# Patient Record
Sex: Female | Born: 1970 | Race: Asian | Hispanic: No | Marital: Married | Smoking: Never smoker
Health system: Southern US, Community
[De-identification: ages and names within clinical notes are randomized; demographics above are authoritative.]

## PROBLEM LIST (undated history)

## (undated) DIAGNOSIS — K921 Melena: Secondary | ICD-10-CM

## (undated) DIAGNOSIS — Z923 Personal history of irradiation: Secondary | ICD-10-CM

## (undated) DIAGNOSIS — R519 Headache, unspecified: Secondary | ICD-10-CM

## (undated) DIAGNOSIS — R51 Headache: Secondary | ICD-10-CM

## (undated) DIAGNOSIS — C50919 Malignant neoplasm of unspecified site of unspecified female breast: Secondary | ICD-10-CM

## (undated) DIAGNOSIS — G43909 Migraine, unspecified, not intractable, without status migrainosus: Secondary | ICD-10-CM

## (undated) DIAGNOSIS — Z9221 Personal history of antineoplastic chemotherapy: Secondary | ICD-10-CM

## (undated) DIAGNOSIS — C801 Malignant (primary) neoplasm, unspecified: Secondary | ICD-10-CM

## (undated) HISTORY — PX: BREAST SURGERY: SHX581

## (undated) HISTORY — DX: Headache: R51

## (undated) HISTORY — DX: Migraine, unspecified, not intractable, without status migrainosus: G43.909

## (undated) HISTORY — DX: Headache, unspecified: R51.9

## (undated) HISTORY — DX: Malignant (primary) neoplasm, unspecified: C80.1

## (undated) HISTORY — DX: Melena: K92.1

---

## 2002-04-18 ENCOUNTER — Other Ambulatory Visit: Admission: RE | Admit: 2002-04-18 | Discharge: 2002-04-18 | Payer: Self-pay | Admitting: Obstetrics and Gynecology

## 2002-11-01 ENCOUNTER — Inpatient Hospital Stay (HOSPITAL_COMMUNITY): Admission: AD | Admit: 2002-11-01 | Discharge: 2002-11-01 | Payer: Self-pay | Admitting: *Deleted

## 2002-11-02 ENCOUNTER — Inpatient Hospital Stay (HOSPITAL_COMMUNITY): Admission: AD | Admit: 2002-11-02 | Discharge: 2002-11-04 | Payer: Self-pay | Admitting: Obstetrics and Gynecology

## 2004-08-23 ENCOUNTER — Encounter: Admission: RE | Admit: 2004-08-23 | Discharge: 2004-08-23 | Payer: Self-pay | Admitting: Internal Medicine

## 2006-01-24 DIAGNOSIS — Z9221 Personal history of antineoplastic chemotherapy: Secondary | ICD-10-CM

## 2006-01-24 HISTORY — PX: BREAST BIOPSY: SHX20

## 2006-01-24 HISTORY — PX: BREAST LUMPECTOMY: SHX2

## 2006-01-24 HISTORY — DX: Personal history of antineoplastic chemotherapy: Z92.21

## 2006-04-04 ENCOUNTER — Encounter: Admission: RE | Admit: 2006-04-04 | Discharge: 2006-04-04 | Payer: Self-pay | Admitting: Internal Medicine

## 2006-04-11 ENCOUNTER — Encounter (INDEPENDENT_AMBULATORY_CARE_PROVIDER_SITE_OTHER): Payer: Self-pay | Admitting: Radiology

## 2006-04-11 ENCOUNTER — Encounter (INDEPENDENT_AMBULATORY_CARE_PROVIDER_SITE_OTHER): Payer: Self-pay | Admitting: Specialist

## 2006-04-11 ENCOUNTER — Encounter: Admission: RE | Admit: 2006-04-11 | Discharge: 2006-04-11 | Payer: Self-pay | Admitting: Internal Medicine

## 2006-04-19 ENCOUNTER — Encounter: Admission: RE | Admit: 2006-04-19 | Discharge: 2006-04-19 | Payer: Self-pay | Admitting: Internal Medicine

## 2006-04-28 ENCOUNTER — Encounter: Admission: RE | Admit: 2006-04-28 | Discharge: 2006-04-28 | Payer: Self-pay | Admitting: Urology

## 2006-05-01 ENCOUNTER — Ambulatory Visit (HOSPITAL_COMMUNITY): Admission: RE | Admit: 2006-05-01 | Discharge: 2006-05-01 | Payer: Self-pay | Admitting: Nephrology

## 2006-05-01 ENCOUNTER — Ambulatory Visit (HOSPITAL_BASED_OUTPATIENT_CLINIC_OR_DEPARTMENT_OTHER): Admission: RE | Admit: 2006-05-01 | Discharge: 2006-05-01 | Payer: Self-pay | Admitting: General Surgery

## 2006-05-01 ENCOUNTER — Encounter (INDEPENDENT_AMBULATORY_CARE_PROVIDER_SITE_OTHER): Payer: Self-pay | Admitting: Specialist

## 2006-05-11 ENCOUNTER — Ambulatory Visit: Payer: Self-pay | Admitting: Oncology

## 2006-05-17 LAB — CBC WITH DIFFERENTIAL/PLATELET
Basophils Absolute: 0 10*3/uL (ref 0.0–0.1)
Eosinophils Absolute: 0.1 10*3/uL (ref 0.0–0.5)
HGB: 12.2 g/dL (ref 11.6–15.9)
LYMPH%: 26.3 % (ref 14.0–48.0)
MCV: 86.3 fL (ref 81.0–101.0)
MONO%: 9.8 % (ref 0.0–13.0)
NEUT#: 2.3 10*3/uL (ref 1.5–6.5)
Platelets: 296 10*3/uL (ref 145–400)

## 2006-05-17 LAB — COMPREHENSIVE METABOLIC PANEL
Albumin: 4.4 g/dL (ref 3.5–5.2)
Alkaline Phosphatase: 44 U/L (ref 39–117)
BUN: 11 mg/dL (ref 6–23)
CO2: 26 mEq/L (ref 19–32)
Glucose, Bld: 121 mg/dL — ABNORMAL HIGH (ref 70–99)
Potassium: 4 mEq/L (ref 3.5–5.3)

## 2006-05-17 LAB — CANCER ANTIGEN 27.29: CA 27.29: 17 U/mL (ref 0–39)

## 2006-05-17 LAB — LACTATE DEHYDROGENASE: LDH: 126 U/L (ref 94–250)

## 2006-05-23 ENCOUNTER — Ambulatory Visit (HOSPITAL_COMMUNITY): Admission: RE | Admit: 2006-05-23 | Discharge: 2006-05-23 | Payer: Self-pay | Admitting: Oncology

## 2006-06-06 LAB — HEPATITIS B SURFACE ANTIGEN: Hepatitis B Surface Ag: NEGATIVE

## 2006-06-06 LAB — HEPATITIS C ANTIBODY: HCV Ab: NEGATIVE

## 2006-06-14 ENCOUNTER — Encounter (INDEPENDENT_AMBULATORY_CARE_PROVIDER_SITE_OTHER): Payer: Self-pay | Admitting: Gynecology

## 2006-06-14 ENCOUNTER — Ambulatory Visit: Payer: Self-pay | Admitting: Obstetrics and Gynecology

## 2006-06-21 ENCOUNTER — Ambulatory Visit: Payer: Self-pay | Admitting: Oncology

## 2006-06-21 LAB — CBC WITH DIFFERENTIAL/PLATELET
BASO%: 1.2 % (ref 0.0–2.0)
LYMPH%: 32.4 % (ref 14.0–48.0)
MCHC: 33.9 g/dL (ref 32.0–36.0)
MONO#: 0.4 10*3/uL (ref 0.1–0.9)
Platelets: 282 10*3/uL (ref 145–400)
RBC: 4.53 10*6/uL (ref 3.70–5.32)
WBC: 4.9 10*3/uL (ref 3.9–10.0)
lymph#: 1.6 10*3/uL (ref 0.9–3.3)

## 2006-06-28 LAB — CBC WITH DIFFERENTIAL/PLATELET
BASO%: 0.1 % (ref 0.0–2.0)
HCT: 34.6 % — ABNORMAL LOW (ref 34.8–46.6)
MCHC: 34.6 g/dL (ref 32.0–36.0)
MONO#: 0.7 10*3/uL (ref 0.1–0.9)
NEUT%: 86.3 % — ABNORMAL HIGH (ref 39.6–76.8)
RDW: 12.8 % (ref 11.3–14.5)
WBC: 21 10*3/uL — ABNORMAL HIGH (ref 3.9–10.0)
lymph#: 2 10*3/uL (ref 0.9–3.3)

## 2006-07-05 LAB — CBC WITH DIFFERENTIAL/PLATELET
Basophils Absolute: 0 10*3/uL (ref 0.0–0.1)
Eosinophils Absolute: 0 10*3/uL (ref 0.0–0.5)
HCT: 34.7 % — ABNORMAL LOW (ref 34.8–46.6)
HGB: 12 g/dL (ref 11.6–15.9)
MCH: 29.5 pg (ref 26.0–34.0)
MONO#: 0.3 10*3/uL (ref 0.1–0.9)
NEUT#: 3.3 10*3/uL (ref 1.5–6.5)
NEUT%: 65.9 % (ref 39.6–76.8)
WBC: 4.9 10*3/uL (ref 3.9–10.0)
lymph#: 1.3 10*3/uL (ref 0.9–3.3)

## 2006-07-10 LAB — CBC WITH DIFFERENTIAL/PLATELET
Basophils Absolute: 0 10*3/uL (ref 0.0–0.1)
EOS%: 0.4 % (ref 0.0–7.0)
Eosinophils Absolute: 0 10*3/uL (ref 0.0–0.5)
HCT: 33.6 % — ABNORMAL LOW (ref 34.8–46.6)
HGB: 11.8 g/dL (ref 11.6–15.9)
MCH: 30.2 pg (ref 26.0–34.0)
MCV: 86.3 fL (ref 81.0–101.0)
MONO%: 6.9 % (ref 0.0–13.0)
NEUT#: 2.5 10*3/uL (ref 1.5–6.5)
NEUT%: 59.1 % (ref 39.6–76.8)

## 2006-07-17 LAB — CBC WITH DIFFERENTIAL/PLATELET
Basophils Absolute: 0 10*3/uL (ref 0.0–0.1)
EOS%: 0.4 % (ref 0.0–7.0)
Eosinophils Absolute: 0.1 10*3/uL (ref 0.0–0.5)
HCT: 33.8 % — ABNORMAL LOW (ref 34.8–46.6)
HGB: 11.6 g/dL (ref 11.6–15.9)
MCH: 29.8 pg (ref 26.0–34.0)
MCV: 86.6 fL (ref 81.0–101.0)
MONO%: 5.7 % (ref 0.0–13.0)
NEUT#: 14.3 10*3/uL — ABNORMAL HIGH (ref 1.5–6.5)
NEUT%: 83.1 % — ABNORMAL HIGH (ref 39.6–76.8)
lymph#: 1.8 10*3/uL (ref 0.9–3.3)

## 2006-07-17 LAB — COMPREHENSIVE METABOLIC PANEL
AST: 28 U/L (ref 0–37)
Albumin: 4.3 g/dL (ref 3.5–5.2)
BUN: 12 mg/dL (ref 6–23)
Calcium: 9.5 mg/dL (ref 8.4–10.5)
Chloride: 105 mEq/L (ref 96–112)
Creatinine, Ser: 0.71 mg/dL (ref 0.40–1.20)
Glucose, Bld: 93 mg/dL (ref 70–99)
Potassium: 4.2 mEq/L (ref 3.5–5.3)

## 2006-07-31 LAB — CBC WITH DIFFERENTIAL/PLATELET
BASO%: 2.8 % — ABNORMAL HIGH (ref 0.0–2.0)
Basophils Absolute: 0.1 10*3/uL (ref 0.0–0.1)
EOS%: 1.3 % (ref 0.0–7.0)
Eosinophils Absolute: 0 10*3/uL (ref 0.0–0.5)
HCT: 33.2 % — ABNORMAL LOW (ref 34.8–46.6)
HGB: 11.5 g/dL — ABNORMAL LOW (ref 11.6–15.9)
LYMPH%: 35.1 % (ref 14.0–48.0)
MCH: 29.4 pg (ref 26.0–34.0)
MCHC: 34.5 g/dL (ref 32.0–36.0)
MCV: 85.1 fL (ref 81.0–101.0)
MONO#: 0.3 10*3/uL (ref 0.1–0.9)
MONO%: 8.3 % (ref 0.0–13.0)
NEUT#: 1.6 10*3/uL (ref 1.5–6.5)
NEUT%: 52.4 % (ref 39.6–76.8)
Platelets: 242 10*3/uL (ref 145–400)
RBC: 3.9 10*6/uL (ref 3.70–5.32)
RDW: 13.2 % (ref 11.3–14.5)
WBC: 3 10*3/uL — ABNORMAL LOW (ref 3.9–10.0)
lymph#: 1.1 10*3/uL (ref 0.9–3.3)

## 2006-07-31 LAB — COMPREHENSIVE METABOLIC PANEL
ALT: 39 U/L — ABNORMAL HIGH (ref 0–35)
AST: 22 U/L (ref 0–37)
Albumin: 4.8 g/dL (ref 3.5–5.2)
Alkaline Phosphatase: 62 U/L (ref 39–117)
BUN: 11 mg/dL (ref 6–23)
CO2: 22 mEq/L (ref 19–32)
Calcium: 9.6 mg/dL (ref 8.4–10.5)
Chloride: 107 mEq/L (ref 96–112)
Creatinine, Ser: 0.62 mg/dL (ref 0.40–1.20)
Glucose, Bld: 66 mg/dL — ABNORMAL LOW (ref 70–99)
Potassium: 3.7 mEq/L (ref 3.5–5.3)
Sodium: 142 mEq/L (ref 135–145)
Total Bilirubin: 0.6 mg/dL (ref 0.3–1.2)
Total Protein: 7.5 g/dL (ref 6.0–8.3)

## 2006-08-03 ENCOUNTER — Ambulatory Visit: Payer: Self-pay | Admitting: Oncology

## 2006-08-07 LAB — CBC WITH DIFFERENTIAL/PLATELET
BASO%: 0.3 % (ref 0.0–2.0)
Basophils Absolute: 0 10*3/uL (ref 0.0–0.1)
EOS%: 0.8 % (ref 0.0–7.0)
HCT: 30.3 % — ABNORMAL LOW (ref 34.8–46.6)
HGB: 10.5 g/dL — ABNORMAL LOW (ref 11.6–15.9)
MCH: 30.2 pg (ref 26.0–34.0)
MONO#: 0.7 10*3/uL (ref 0.1–0.9)
NEUT#: 11.7 10*3/uL — ABNORMAL HIGH (ref 1.5–6.5)
RDW: 16.1 % — ABNORMAL HIGH (ref 11.3–14.5)
WBC: 14.2 10*3/uL — ABNORMAL HIGH (ref 3.9–10.0)
lymph#: 1.6 10*3/uL (ref 0.9–3.3)

## 2006-08-21 ENCOUNTER — Other Ambulatory Visit: Payer: Self-pay | Admitting: Oncology

## 2006-08-21 LAB — CBC WITH DIFFERENTIAL/PLATELET
Basophils Absolute: 0.1 10*3/uL (ref 0.0–0.1)
Eosinophils Absolute: 0 10*3/uL (ref 0.0–0.5)
HCT: 31.2 % — ABNORMAL LOW (ref 34.8–46.6)
HGB: 10.6 g/dL — ABNORMAL LOW (ref 11.6–15.9)
MCV: 87.8 fL (ref 81.0–101.0)
NEUT#: 2.1 10*3/uL (ref 1.5–6.5)
NEUT%: 61.4 % (ref 39.6–76.8)
RDW: 14.4 % (ref 11.3–14.5)
lymph#: 0.9 10*3/uL (ref 0.9–3.3)

## 2006-08-21 LAB — COMPREHENSIVE METABOLIC PANEL
AST: 19 U/L (ref 0–37)
Albumin: 4.7 g/dL (ref 3.5–5.2)
Alkaline Phosphatase: 56 U/L (ref 39–117)
BUN: 13 mg/dL (ref 6–23)
Potassium: 3.6 mEq/L (ref 3.5–5.3)

## 2006-08-28 ENCOUNTER — Ambulatory Visit: Admission: RE | Admit: 2006-08-28 | Discharge: 2006-11-14 | Payer: Self-pay | Admitting: *Deleted

## 2006-08-28 LAB — CBC WITH DIFFERENTIAL/PLATELET
Eosinophils Absolute: 0.1 10*3/uL (ref 0.0–0.5)
HCT: 30.1 % — ABNORMAL LOW (ref 34.8–46.6)
LYMPH%: 20.2 % (ref 14.0–48.0)
MCHC: 35 g/dL (ref 32.0–36.0)
MONO#: 1 10*3/uL — ABNORMAL HIGH (ref 0.1–0.9)
NEUT#: 3.6 10*3/uL (ref 1.5–6.5)
NEUT%: 60.4 % (ref 39.6–76.8)
Platelets: 203 10*3/uL (ref 145–400)
WBC: 5.9 10*3/uL (ref 3.9–10.0)

## 2006-09-20 ENCOUNTER — Ambulatory Visit: Payer: Self-pay | Admitting: Oncology

## 2006-09-20 LAB — CBC WITH DIFFERENTIAL/PLATELET
BASO%: 1.2 % (ref 0.0–2.0)
EOS%: 1.4 % (ref 0.0–7.0)
HCT: 32.9 % — ABNORMAL LOW (ref 34.8–46.6)
LYMPH%: 20.5 % (ref 14.0–48.0)
MCH: 31.5 pg (ref 26.0–34.0)
MCHC: 35.3 g/dL (ref 32.0–36.0)
NEUT%: 69.8 % (ref 39.6–76.8)
Platelets: 279 10*3/uL (ref 145–400)

## 2006-09-20 LAB — COMPREHENSIVE METABOLIC PANEL
ALT: 17 U/L (ref 0–35)
AST: 16 U/L (ref 0–37)
CO2: 27 mEq/L (ref 19–32)
Creatinine, Ser: 0.75 mg/dL (ref 0.40–1.20)
Total Bilirubin: 0.7 mg/dL (ref 0.3–1.2)

## 2006-09-20 LAB — CANCER ANTIGEN 27.29: CA 27.29: 22 U/mL (ref 0–39)

## 2006-11-06 ENCOUNTER — Ambulatory Visit: Payer: Self-pay | Admitting: Oncology

## 2007-01-24 ENCOUNTER — Ambulatory Visit: Payer: Self-pay | Admitting: Oncology

## 2007-01-30 LAB — CBC WITH DIFFERENTIAL/PLATELET
BASO%: 0.5 % (ref 0.0–2.0)
Basophils Absolute: 0 10*3/uL (ref 0.0–0.1)
HCT: 33.6 % — ABNORMAL LOW (ref 34.8–46.6)
HGB: 11.9 g/dL (ref 11.6–15.9)
MONO#: 0.3 10*3/uL (ref 0.1–0.9)
NEUT%: 60.3 % (ref 39.6–76.8)
RDW: 14.2 % (ref 11.3–14.5)
WBC: 4.3 10*3/uL (ref 3.9–10.0)
lymph#: 1.3 10*3/uL (ref 0.9–3.3)

## 2007-01-30 LAB — COMPREHENSIVE METABOLIC PANEL
ALT: 13 U/L (ref 0–35)
AST: 18 U/L (ref 0–37)
Albumin: 4.1 g/dL (ref 3.5–5.2)
BUN: 14 mg/dL (ref 6–23)
CO2: 24 mEq/L (ref 19–32)
Calcium: 9 mg/dL (ref 8.4–10.5)
Chloride: 108 mEq/L (ref 96–112)
Creatinine, Ser: 0.87 mg/dL (ref 0.40–1.20)
Potassium: 3.5 mEq/L (ref 3.5–5.3)

## 2007-01-30 LAB — CANCER ANTIGEN 27.29: CA 27.29: 11 U/mL (ref 0–39)

## 2007-04-05 ENCOUNTER — Encounter: Admission: RE | Admit: 2007-04-05 | Discharge: 2007-04-05 | Payer: Self-pay | Admitting: Oncology

## 2007-08-30 ENCOUNTER — Ambulatory Visit: Payer: Self-pay | Admitting: Oncology

## 2007-09-03 LAB — COMPREHENSIVE METABOLIC PANEL
Albumin: 3.7 g/dL (ref 3.5–5.2)
Alkaline Phosphatase: 66 U/L (ref 39–117)
BUN: 11 mg/dL (ref 6–23)
CO2: 27 mEq/L (ref 19–32)
Glucose, Bld: 100 mg/dL — ABNORMAL HIGH (ref 70–99)
Sodium: 138 mEq/L (ref 135–145)
Total Bilirubin: 0.7 mg/dL (ref 0.3–1.2)
Total Protein: 6.6 g/dL (ref 6.0–8.3)

## 2007-09-03 LAB — CBC WITH DIFFERENTIAL/PLATELET
Basophils Absolute: 0 10*3/uL (ref 0.0–0.1)
Eosinophils Absolute: 0.1 10*3/uL (ref 0.0–0.5)
HCT: 35.4 % (ref 34.8–46.6)
HGB: 12.1 g/dL (ref 11.6–15.9)
LYMPH%: 46.1 % (ref 14.0–48.0)
MCV: 88.8 fL (ref 81.0–101.0)
MONO#: 0.3 10*3/uL (ref 0.1–0.9)
MONO%: 6.6 % (ref 0.0–13.0)
NEUT#: 2 10*3/uL (ref 1.5–6.5)
Platelets: 230 10*3/uL (ref 145–400)
WBC: 4.5 10*3/uL (ref 3.9–10.0)

## 2007-09-03 LAB — FOLLICLE STIMULATING HORMONE: FSH: 9.2 m[IU]/mL

## 2007-09-03 LAB — CANCER ANTIGEN 27.29: CA 27.29: 17 U/mL (ref 0–39)

## 2008-02-22 ENCOUNTER — Ambulatory Visit: Payer: Self-pay | Admitting: Oncology

## 2008-03-05 ENCOUNTER — Ambulatory Visit: Payer: Self-pay | Admitting: Genetic Counselor

## 2008-04-07 ENCOUNTER — Encounter: Admission: RE | Admit: 2008-04-07 | Discharge: 2008-04-07 | Payer: Self-pay | Admitting: Oncology

## 2008-04-18 ENCOUNTER — Encounter: Admission: RE | Admit: 2008-04-18 | Discharge: 2008-04-18 | Payer: Self-pay | Admitting: Oncology

## 2008-08-29 ENCOUNTER — Ambulatory Visit: Payer: Self-pay | Admitting: Genetic Counselor

## 2008-09-02 ENCOUNTER — Ambulatory Visit: Payer: Self-pay | Admitting: Oncology

## 2008-09-02 LAB — CBC WITH DIFFERENTIAL/PLATELET
BASO%: 0.6 % (ref 0.0–2.0)
HCT: 36.6 % (ref 34.8–46.6)
MCHC: 34.3 g/dL (ref 31.5–36.0)
MONO#: 0.3 10*3/uL (ref 0.1–0.9)
RBC: 4.1 10*6/uL (ref 3.70–5.45)
RDW: 13 % (ref 11.2–14.5)
WBC: 5.3 10*3/uL (ref 3.9–10.3)
lymph#: 1.7 10*3/uL (ref 0.9–3.3)

## 2008-09-02 LAB — COMPREHENSIVE METABOLIC PANEL
ALT: 16 U/L (ref 0–35)
CO2: 26 mEq/L (ref 19–32)
Calcium: 9.1 mg/dL (ref 8.4–10.5)
Chloride: 108 mEq/L (ref 96–112)
Potassium: 3.8 mEq/L (ref 3.5–5.3)
Sodium: 139 mEq/L (ref 135–145)
Total Protein: 7.2 g/dL (ref 6.0–8.3)

## 2008-09-03 LAB — CANCER ANTIGEN 27.29: CA 27.29: 23 U/mL (ref 0–39)

## 2008-09-03 LAB — VITAMIN D 25 HYDROXY (VIT D DEFICIENCY, FRACTURES): Vit D, 25-Hydroxy: 25 ng/mL — ABNORMAL LOW (ref 30–89)

## 2009-02-27 ENCOUNTER — Ambulatory Visit: Payer: Self-pay | Admitting: Oncology

## 2009-03-03 LAB — CBC WITH DIFFERENTIAL/PLATELET
BASO%: 0.7 % (ref 0.0–2.0)
Basophils Absolute: 0 10*3/uL (ref 0.0–0.1)
EOS%: 1.5 % (ref 0.0–7.0)
HCT: 36.9 % (ref 34.8–46.6)
LYMPH%: 40.9 % (ref 14.0–49.7)
MCH: 31.4 pg (ref 25.1–34.0)
MONO%: 6.8 % (ref 0.0–14.0)
Platelets: 266 10*3/uL (ref 145–400)
WBC: 4.9 10*3/uL (ref 3.9–10.3)

## 2009-03-03 LAB — COMPREHENSIVE METABOLIC PANEL
ALT: 12 U/L (ref 0–35)
Alkaline Phosphatase: 46 U/L (ref 39–117)
CO2: 23 mEq/L (ref 19–32)
Calcium: 9 mg/dL (ref 8.4–10.5)
Glucose, Bld: 94 mg/dL (ref 70–99)
Potassium: 3.8 mEq/L (ref 3.5–5.3)
Sodium: 138 mEq/L (ref 135–145)
Total Bilirubin: 0.5 mg/dL (ref 0.3–1.2)

## 2009-03-03 LAB — CANCER ANTIGEN 27.29: CA 27.29: 8 U/mL (ref 0–39)

## 2009-04-08 ENCOUNTER — Encounter: Admission: RE | Admit: 2009-04-08 | Discharge: 2009-04-08 | Payer: Self-pay | Admitting: Oncology

## 2009-10-16 ENCOUNTER — Ambulatory Visit: Payer: Self-pay | Admitting: Obstetrics and Gynecology

## 2009-10-16 LAB — CONVERTED CEMR LAB: Pap Smear: NEGATIVE

## 2010-02-14 ENCOUNTER — Encounter: Payer: Self-pay | Admitting: Oncology

## 2010-03-03 ENCOUNTER — Other Ambulatory Visit: Payer: Self-pay | Admitting: Oncology

## 2010-03-03 ENCOUNTER — Encounter (HOSPITAL_BASED_OUTPATIENT_CLINIC_OR_DEPARTMENT_OTHER): Payer: Self-pay | Admitting: Oncology

## 2010-03-03 DIAGNOSIS — Z170 Estrogen receptor positive status [ER+]: Secondary | ICD-10-CM

## 2010-03-03 DIAGNOSIS — C50419 Malignant neoplasm of upper-outer quadrant of unspecified female breast: Secondary | ICD-10-CM

## 2010-03-03 LAB — COMPREHENSIVE METABOLIC PANEL
ALT: 12 U/L (ref 0–35)
AST: 20 U/L (ref 0–37)
Albumin: 4.1 g/dL (ref 3.5–5.2)
Calcium: 9.2 mg/dL (ref 8.4–10.5)
Creatinine, Ser: 0.83 mg/dL (ref 0.40–1.20)
Potassium: 3.9 mEq/L (ref 3.5–5.3)
Sodium: 139 mEq/L (ref 135–145)
Total Protein: 6.6 g/dL (ref 6.0–8.3)

## 2010-03-03 LAB — CBC WITH DIFFERENTIAL/PLATELET
BASO%: 0.6 % (ref 0.0–2.0)
LYMPH%: 38.6 % (ref 14.0–49.7)
MCH: 30.2 pg (ref 25.1–34.0)
MONO#: 0.4 10*3/uL (ref 0.1–0.9)
MONO%: 8.3 % (ref 0.0–14.0)
NEUT%: 51 % (ref 38.4–76.8)
lymph#: 1.9 10*3/uL (ref 0.9–3.3)

## 2010-03-05 ENCOUNTER — Other Ambulatory Visit: Payer: Self-pay | Admitting: Oncology

## 2010-03-05 DIAGNOSIS — Z1231 Encounter for screening mammogram for malignant neoplasm of breast: Secondary | ICD-10-CM

## 2010-03-05 DIAGNOSIS — Z9889 Other specified postprocedural states: Secondary | ICD-10-CM

## 2010-03-11 ENCOUNTER — Encounter (HOSPITAL_BASED_OUTPATIENT_CLINIC_OR_DEPARTMENT_OTHER): Payer: Self-pay | Admitting: Oncology

## 2010-03-11 ENCOUNTER — Encounter: Payer: Self-pay | Admitting: Oncology

## 2010-03-11 ENCOUNTER — Other Ambulatory Visit: Payer: Self-pay | Admitting: Oncology

## 2010-03-11 DIAGNOSIS — Z170 Estrogen receptor positive status [ER+]: Secondary | ICD-10-CM

## 2010-03-11 DIAGNOSIS — D1739 Benign lipomatous neoplasm of skin and subcutaneous tissue of other sites: Secondary | ICD-10-CM

## 2010-03-11 DIAGNOSIS — Z9889 Other specified postprocedural states: Secondary | ICD-10-CM

## 2010-03-11 DIAGNOSIS — C50419 Malignant neoplasm of upper-outer quadrant of unspecified female breast: Secondary | ICD-10-CM

## 2010-03-11 DIAGNOSIS — Z1231 Encounter for screening mammogram for malignant neoplasm of breast: Secondary | ICD-10-CM

## 2010-04-12 ENCOUNTER — Other Ambulatory Visit: Payer: Self-pay

## 2010-04-21 ENCOUNTER — Ambulatory Visit
Admission: RE | Admit: 2010-04-21 | Discharge: 2010-04-21 | Disposition: A | Discharge status: Home / Self Care | Payer: Self-pay | Source: Ambulatory Visit | Attending: Oncology | Admitting: Oncology

## 2010-04-21 DIAGNOSIS — Z1231 Encounter for screening mammogram for malignant neoplasm of breast: Secondary | ICD-10-CM

## 2010-04-21 DIAGNOSIS — Z9889 Other specified postprocedural states: Secondary | ICD-10-CM

## 2010-06-11 NOTE — Op Note (Signed)
NAMEMAGDALA, BRAHMBHATT                    ACCOUNT NO.:  [id number]   MEDICAL RECORD NO.:  [id number]          PATIENT TYPE:  AMB   LOCATION:  DSC                          FACILITY:  [facility]   PHYSICIAN:  Timothy E. Earlene Plater, M.D. DATE OF BIRTH:  [birth year]   DATE OF PROCEDURE:  05/01/2006  DATE OF DISCHARGE:                               OPERATIVE REPORT   PREOPERATIVE DIAGNOSIS:  Left breast cancer.   POSTOPERATIVE DIAGNOSIS:  Left breast cancer.   PROCEDURE:  Injection methylene blue, partial mastectomy, sentinel node  biopsy x1.   SURGEON:  Timothy E. Earlene Plater, M.D.   ANESTHESIA:  General.   Sierra Mendoza is Asian American, 62, gravida 3.  No family or personal history  of breast disease.  Developed a mass in the left breast,  was seen and  evaluated at [facility] where a core biopsy was  done of a palpable mass showing invasive carcinoma.  Follow-up films and  MRI confirmed a 2-3 cm mass upper outer quadrant left breast.  She was  seen and counseled in the office both directly and with interpretation  and she is very understanding and ready to proceed.  The patient had  been injected with sulfur colloid about 45 minutes prior to surgery.  She was seen, identified, the left breast marked and the permit signed.   She was taken to the operating room, placed supine.  LMA anesthesia  provided.  Left breast was examined, the mass was easily palpable at the  upper outer quadrant, it was marked.  The nipple was prepped and draped  in the usual fashion and 5 mL of diluted methylene blue were injected  subcutaneously.  The breast was gently massaged for 10 minutes.  It was  prepped and draped in the usual fashion.  I tested first with the  Neoprobe, found one hot spot in the axilla.  Incidentally, there were no  other hot spots on the chest except for the breast and the one in the  axilla.  Marcaine 0.25% was used prior to each incision.  A small  incision in the left axilla.  The  deep space entered and using the  Neoprobe for direction, a single hot blue lymph node was found and  gently teased away from the surrounding fatty tissue.  It was marked and  sent as hot blue lymph node #1.  Further searching of the axilla  revealed no other hot areas and I could not see any other blue areas.  That wound was covered.  Again Marcaine was used.  A generous incision  was made over the upper outer quadrant in the skin fold.  I did remove  an ellipse of skin directly over the tumor.  I dissected the plane  between the superficial deep fat around this mass in the upper outer  quadrant and incorporated the entire upper outer quadrant and medially  and inferiorly it ran up under the nipple.  The posterior border was the  pectoralis muscle.  The specimen was marked and sent for pathology.  The  wound was dry with the Bovie, irrigated and it was clean and dry.  Counts were correct.  I began to close both wounds in layers.  Dr. Laureen Ochs  called and returned one lymph node negative for tumor and then later  apparently gross adequate margins.  So  we will await final reports.  The wounds were dressed.  Steri-Strips  applied.  Final counts correct and she was removed to the recovery room  in good condition.  Written and verbal instructions given including  Vicodin.  She will be seen and followed as an outpatient.      Timothy E. Earlene Plater, M.D.  Electronically Signed     TED/MEDQ  D:  05/01/2006  T:  05/01/2006  Job:  [id number]   cc:   [facility]

## 2011-03-07 ENCOUNTER — Other Ambulatory Visit (HOSPITAL_BASED_OUTPATIENT_CLINIC_OR_DEPARTMENT_OTHER): Payer: Self-pay | Admitting: Lab

## 2011-03-07 DIAGNOSIS — C50419 Malignant neoplasm of upper-outer quadrant of unspecified female breast: Secondary | ICD-10-CM

## 2011-03-07 DIAGNOSIS — Z170 Estrogen receptor positive status [ER+]: Secondary | ICD-10-CM

## 2011-03-07 LAB — COMPREHENSIVE METABOLIC PANEL
ALT: 10 U/L (ref 0–35)
AST: 19 U/L (ref 0–37)
Albumin: 3.8 g/dL (ref 3.5–5.2)
Alkaline Phosphatase: 42 U/L (ref 39–117)
Calcium: 9.2 mg/dL (ref 8.4–10.5)
Chloride: 108 mEq/L (ref 96–112)
Potassium: 4.1 mEq/L (ref 3.5–5.3)
Sodium: 142 mEq/L (ref 135–145)
Total Protein: 6.2 g/dL (ref 6.0–8.3)

## 2011-03-07 LAB — CBC WITH DIFFERENTIAL/PLATELET
BASO%: 0.6 % (ref 0.0–2.0)
Basophils Absolute: 0 10*3/uL (ref 0.0–0.1)
EOS%: 2.4 % (ref 0.0–7.0)
HGB: 11.8 g/dL (ref 11.6–15.9)
MCH: 30.6 pg (ref 25.1–34.0)
MCV: 89.9 fL (ref 79.5–101.0)
MONO%: 7.7 % (ref 0.0–14.0)
RBC: 3.85 10*6/uL (ref 3.70–5.45)
RDW: 13.8 % (ref 11.2–14.5)
lymph#: 2.1 10*3/uL (ref 0.9–3.3)

## 2011-03-16 ENCOUNTER — Ambulatory Visit (HOSPITAL_BASED_OUTPATIENT_CLINIC_OR_DEPARTMENT_OTHER): Payer: Self-pay | Admitting: Oncology

## 2011-03-16 ENCOUNTER — Other Ambulatory Visit: Payer: Self-pay | Admitting: Oncology

## 2011-03-16 ENCOUNTER — Telehealth: Payer: Self-pay | Admitting: *Deleted

## 2011-03-16 VITALS — BP 108/72 | HR 72 | Temp 98.8°F | Ht 61.0 in | Wt 122.4 lb

## 2011-03-16 DIAGNOSIS — C50912 Malignant neoplasm of unspecified site of left female breast: Secondary | ICD-10-CM | POA: Insufficient documentation

## 2011-03-16 DIAGNOSIS — Z853 Personal history of malignant neoplasm of breast: Secondary | ICD-10-CM

## 2011-03-16 DIAGNOSIS — C50919 Malignant neoplasm of unspecified site of unspecified female breast: Secondary | ICD-10-CM

## 2011-03-16 MED ORDER — TAMOXIFEN CITRATE 20 MG PO TABS: 20.0000 mg | ORAL_TABLET | Freq: Every day | ORAL | Status: DC

## 2011-03-16 NOTE — Progress Notes (Signed)
ID: Sierra Mendoza   DOB: January 24, 1971  MR#: [id number]  CSN#:[id number]  HISTORY OF PRESENT ILLNESS: The patient herself palpated a mass in her left breast.  I believe she brought this to Dr. Molly Maduro Mendoza's attention and he set her up for mammography on 04/04/2006.  This indeed confirmed the presence of a possible mass in the left breast and diagnostic mammography on 04/11/2006 confirmed the presence of an ill-defined density in the left breast, which by ultrasound was irregular and hypoechoic, measuring up to 1.8 cm.  The mass was biopsied the same day and this showed ([id number] and [phone]) an invasive ductal carcinoma, which was 99% positive for the estrogen receptor, 100% positive for the progesterone receptor, with a low proliferation marker at 13%, negative for HER-2 at 1+.    With this information, the patient was referred to Dr. Earlene Mendoza and on 04/19/2006, bilateral breast MRI were obtained.  This showed only the solitary area of enhancement in the left breast, which measured  2.0 cm by MRI.  There was a 2.8-cm non-enhancing mass in the right hepatic lobe, felt most likely to be a cyst.  With this information, after appropriate discussion, the patient proceeded to definitive left lumpectomy and sentinel lymph node biopsy under Sierra Mendoza, 05/01/2006.  The final pathology ([id number]) showed a 2.0-cm infiltrating ductal carcinoma, grade 2, with negative margins and no evidence of lymphovascular invasion.  The sentinel lymph node was negative.  INTERVAL HISTORY: The patient is here today for followup of her breast cancer. A woman Sierra Mendoza is also present today. The interval history is generally unremarkable. The patient continues to work as a [employer] Mendoza, occasionally also working in [place]. Her oldest daughter is currently 29 and there are the usual strains associated with that age group, but overall the family seems to be doing well.  REVIEW OF SYSTEMS: When she doesn't sleep or  works too much she tends to get a right-sided headache. When she does for that is take a sleeping pill and out of bed. That takes care of the problem. Occasionally she has pain in her back. 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 She still having periods, which are regular but now only lasts 3 days. Otherwise detailed review of systems was noncontributory today  PAST MEDICAL HISTORY: No past medical history on file.  PAST SURGICAL HISTORY: S/p cesarean section  FAMILY HISTORY The patient's parents are alive The patient has 2 brothers.  There is no history of breast or ovarian cancer in the family.  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  The patient works there as a [employer]; her husband is a Financial risk analyst.  They have 3 daughters, the oldest currently 17.  The patient's brother is also in [place].     ADVANCED DIRECTIVES:  HEALTH MAINTENANCE: History  Substance Use Topics  . Smoking status: Not on file  . Smokeless tobacco: Not on file  . Alcohol Use: Not on file     Colonoscopy:  PAP: scheduled for 2/13  Bone density:  Lipid panel:  No Known Allergies  Current Outpatient Prescriptions  Medication Sig Dispense Refill  . tamoxifen (NOLVADEX) 20 MG tablet Take 20 mg by mouth daily.  OBJECTIVE: Early middle age oriental woman who appears in good health Filed Vitals:   03/16/11 0943  BP: 108/72  Pulse: 72  Temp: 98.8 F (37.1 Mendoza)     Body mass index is 23.13 kg/(m^2).    ECOG FS: 0  Sclerae unicteric Oropharynx clear No peripheral adenopathy Lungs no rales or rhonchi Heart regular rate and rhythm Abd benign MSK no focal spinal tenderness to vigorous palpation and percussion, no peripheral edema Neuro:  nonfocal Breasts:  right breast no suspicious masses; left breast status post lumpectomy and radiation; no evidence of local recurrence  LAB RESULTS: Lab Results  Component Value Date   WBC 5.1 03/07/2011   NEUTROABS 2.4 03/07/2011   HGB 11.8 03/07/2011   HCT 34.6* 03/07/2011   MCV 89.9 03/07/2011   PLT 242 03/07/2011      Chemistry      Component Value Date/Time   NA 142 03/07/2011 0901   K 4.1 03/07/2011 0901   CL 108 03/07/2011 0901   CO2 27 03/07/2011 0901   BUN 15 03/07/2011 0901   CREATININE 0.68 03/07/2011 0901      Component Value Date/Time   CALCIUM 9.2 03/07/2011 0901   ALKPHOS 42 03/07/2011 0901   AST 19 03/07/2011 0901   ALT 10 03/07/2011 0901   BILITOT 0.2* 03/07/2011 0901       Lab Results  Component Value Date   LABCA2 16 03/07/2011    No results found for this basename: INR:1;PROTIME:1 in the last 168 hours  No results found for this basename: UACOL:1,UAPR:1,USPG:1,UPH:1,UTP:1,UGL:1,UKET:1,UBIL:1,UHGB:1,UNIT:1,UROB:1,ULEU:1,UEPI:1,UWBC:1,URBC:1,UBAC:1,CAST:1,CRYS:1,UCOM:1,BILUA:1 in the last 72 hours   STUDIES: No  new results found.  ASSESSMENT: A 41 year old Mandarin speaker currently residing in [place], status post left lumpectomy and sentinel lymph node biopsy in April 2008 for a T1, N0, stage IA, grade 2 invasive ductal carcinoma, strongly ER/PR positive, HER-2/neu negative with low MIB-1.  Treated adjuvantly with cyclophosphamide and docetaxel x4 followed by radiation therapy.  All of this was completed in October of 2008 at which time patient started tamoxifen, 20 mg daily.    PLAN: The plan is for her to continue tamoxifen to 5 years, which will be October of this year. We will discuss whether she wants to take an additional 5 years given the recent data, but her proptosis is good enough that I think it will not warrant that. She is behind in her Pap smears and we have a free screening clinic next week. I set her up for that as well.  She knows to call for  any problems that may develop before the next visit    Sierra Mendoza    03/16/2011

## 2011-03-16 NOTE — Telephone Encounter (Signed)
ave patient appointment for 10-2011 called Sierra Mendoza and requested a interpret for that appointment  also patient needs an interpreter for the pap smear screening on 03-21-2011

## 2011-04-26 ENCOUNTER — Ambulatory Visit
Admission: RE | Admit: 2011-04-26 | Discharge: 2011-04-26 | Disposition: A | Discharge status: Home / Self Care | Payer: Self-pay | Source: Ambulatory Visit | Attending: Oncology | Admitting: Oncology

## 2011-04-26 DIAGNOSIS — Z853 Personal history of malignant neoplasm of breast: Secondary | ICD-10-CM

## 2011-09-06 ENCOUNTER — Other Ambulatory Visit: Payer: Self-pay | Admitting: *Deleted

## 2011-09-06 ENCOUNTER — Other Ambulatory Visit: Payer: Self-pay | Admitting: Physician Assistant

## 2011-09-06 MED ORDER — TAMOXIFEN CITRATE 20 MG PO TABS: 20.0000 mg | ORAL_TABLET | Freq: Every day | ORAL | Status: DC

## 2011-11-01 ENCOUNTER — Other Ambulatory Visit: Payer: Self-pay | Admitting: *Deleted

## 2011-11-01 DIAGNOSIS — C50919 Malignant neoplasm of unspecified site of unspecified female breast: Secondary | ICD-10-CM

## 2011-11-02 ENCOUNTER — Other Ambulatory Visit (HOSPITAL_BASED_OUTPATIENT_CLINIC_OR_DEPARTMENT_OTHER): Payer: Self-pay | Admitting: Lab

## 2011-11-02 DIAGNOSIS — C50919 Malignant neoplasm of unspecified site of unspecified female breast: Secondary | ICD-10-CM

## 2011-11-02 LAB — CBC WITH DIFFERENTIAL/PLATELET
BASO%: 0.7 % (ref 0.0–2.0)
Eosinophils Absolute: 0.1 10*3/uL (ref 0.0–0.5)
MCHC: 32.6 g/dL (ref 31.5–36.0)
MCV: 88.7 fL (ref 79.5–101.0)
MONO%: 6.7 % (ref 0.0–14.0)
NEUT#: 2.1 10*3/uL (ref 1.5–6.5)
RBC: 4.08 10*6/uL (ref 3.70–5.45)
RDW: 13.4 % (ref 11.2–14.5)
WBC: 4.5 10*3/uL (ref 3.9–10.3)

## 2011-11-02 LAB — COMPREHENSIVE METABOLIC PANEL (CC13)
ALT: 14 U/L (ref 0–55)
AST: 18 U/L (ref 5–34)
Albumin: 3.6 g/dL (ref 3.5–5.0)
Alkaline Phosphatase: 46 U/L (ref 40–150)
Glucose: 89 mg/dl (ref 70–99)
Potassium: 4.2 mEq/L (ref 3.5–5.1)
Sodium: 140 mEq/L (ref 136–145)
Total Protein: 6.5 g/dL (ref 6.4–8.3)

## 2011-11-07 ENCOUNTER — Ambulatory Visit (HOSPITAL_BASED_OUTPATIENT_CLINIC_OR_DEPARTMENT_OTHER): Payer: Self-pay | Admitting: Oncology

## 2011-11-07 ENCOUNTER — Telehealth: Payer: Self-pay | Admitting: Oncology

## 2011-11-07 ENCOUNTER — Encounter: Payer: Self-pay | Admitting: Oncology

## 2011-11-07 VITALS — BP 114/74 | HR 72 | Temp 98.0°F | Resp 20 | Ht 61.0 in | Wt 125.4 lb

## 2011-11-07 DIAGNOSIS — C50419 Malignant neoplasm of upper-outer quadrant of unspecified female breast: Secondary | ICD-10-CM

## 2011-11-07 DIAGNOSIS — Z170 Estrogen receptor positive status [ER+]: Secondary | ICD-10-CM

## 2011-11-07 DIAGNOSIS — C50919 Malignant neoplasm of unspecified site of unspecified female breast: Secondary | ICD-10-CM

## 2011-11-07 NOTE — Progress Notes (Signed)
Spoke with patient. She has no insurance. She said she had discount from [facility] before-one time 1 year and then next time 6 months.She works part time. 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 Advised her need to apply for Medicaid first. I gave her an application for them as well as [facility]. Advised her that Medicaid would need to be filled out first.

## 2011-11-07 NOTE — Telephone Encounter (Signed)
S/w luwanna from the clinical social work dept regarding this pt needing an [place] interpreter the language field is not filled out for luwanna to check

## 2011-11-07 NOTE — Telephone Encounter (Signed)
gve the pt her April,may 2014 apt calendar along with the mammo appt

## 2011-11-07 NOTE — Progress Notes (Signed)
ID: Sierra Mendoza   DOB: 1970-02-03  MR#: [id number]  CSN#:[id number]  HISTORY OF PRESENT ILLNESS: The patient herself palpated a mass in her left breast.  I believe she brought this to Dr. Molly Maduro Doolittle's attention and he set her up for mammography on 04/04/2006.  This indeed confirmed the presence of a possible mass in the left breast and diagnostic mammography on 04/11/2006 confirmed the presence of an ill-defined density in the left breast, which by ultrasound was irregular and hypoechoic, measuring up to 1.8 cm.  The mass was biopsied the same day and this showed ([id number] and [id number]) an invasive ductal carcinoma, which was 99% positive for the estrogen receptor, 100% positive for the progesterone receptor, with a low proliferation marker at 13%, negative for HER-2 at 1+.    With this information, the patient was referred to Dr. Earlene Plater and on 04/19/2006, bilateral breast MRI were obtained.  This showed only the solitary area of enhancement in the left breast, which measured  2.0 cm by MRI.  There was a 2.8-cm non-enhancing mass in the right hepatic lobe, felt most likely to be a cyst.  With this information, after appropriate discussion, the patient proceeded to definitive left lumpectomy and sentinel lymph node biopsy under [facility], 05/01/2006.  The final pathology ([id number]) showed a 2.0-cm infiltrating ductal carcinoma, grade 2, with negative margins and no evidence of lymphovascular invasion.  The sentinel lymph node was negative.  INTERVAL HISTORY:  The patient returns today for followup of her breast cancer. The interval history is generally unremarkable. She is working part-time, going to [employer] most days. Family is "fine".  REVIEW OF SYSTEMS: She is taking tamoxifen with no side effects that she is aware of, including no problems with hot flashes or vaginal wetness. If she doesn't sleep well she can get a mild headache. This is not a new issue for her. She has an area in the left chest wall  lateral to the breast that she feels as little bit full and she wanted me to look at. Otherwise a detailed review of systems today was noncontributory.  PAST MEDICAL HISTORY: No past medical history on file.  PAST SURGICAL HISTORY: S/p cesarean section  FAMILY HISTORY The patient's parents are alive The patient has 2 brothers.  There is no history of breast or ovarian cancer in the family.  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  They have 3 daughters, the oldest currently 81.  The patient's brother is also in [place].     ADVANCED DIRECTIVES:  HEALTH MAINTENANCE: History  Substance Use Topics  . Smoking status: Not on file  . Smokeless tobacco: Not on file  . Alcohol Use: Not on file     Colonoscopy:  PAP: scheduled for 2/13  Bone density:  Lipid panel:  No Known Allergies  Current Outpatient Prescriptions  Medication Sig Dispense Refill  . tamoxifen (NOLVADEX) 20 MG tablet Take 1 tablet (20 mg total) by mouth daily.  90 tablet  PRN    OBJECTIVE: Early middle age oriental woman who appears iwell Filed Vitals:   11/07/11 0908  BP: 114/74  Pulse: 72  Temp: 98 F (36.7 C)  Resp: 20  Body mass index is 23.69 kg/(m^2).    ECOG FS: 0  Sclerae unicteric Oropharynx clear No peripheral adenopathy Lungs no rales or rhonchi Heart regular rate and rhythm Abd benign MSK no focal spinal tenderness to vigorous palpation and percussion, no peripheral edema; the area of concern in the lateral chest wall is really just slightly irregular fatty accumulation postop Neuro: nonfocal Breasts:  right breast no suspicious masses; left breast status post lumpectomy and radiation; no evidence of  local recurrence  LAB RESULTS: Lab Results  Component Value Date   WBC 4.5 11/02/2011   NEUTROABS 2.1 11/02/2011   HGB 11.8 11/02/2011   HCT 36.2 11/02/2011   MCV 88.7 11/02/2011   PLT 219 11/02/2011      Chemistry      Component Value Date/Time   NA 140 11/02/2011 0813   NA 142 03/07/2011 0901   K 4.2 11/02/2011 0813   K 4.1 03/07/2011 0901   CL 107 11/02/2011 0813   CL 108 03/07/2011 0901   CO2 24 11/02/2011 0813   CO2 27 03/07/2011 0901   BUN 12.0 11/02/2011 0813   BUN 15 03/07/2011 0901   CREATININE 0.8 11/02/2011 0813   CREATININE 0.68 03/07/2011 0901      Component Value Date/Time   CALCIUM 9.3 11/02/2011 0813   CALCIUM 9.2 03/07/2011 0901   ALKPHOS 46 11/02/2011 0813   ALKPHOS 42 03/07/2011 0901   AST 18 11/02/2011 0813   AST 19 03/07/2011 0901   ALT 14 11/02/2011 0813   ALT 10 03/07/2011 0901   BILITOT 0.50 11/02/2011 0813   BILITOT 0.2* 03/07/2011 0901       Lab Results  Component Value Date   LABCA2 11 11/02/2011    No results found for this basename: INR:1;PROTIME:1 in the last 168 hours  No results found for this basename: UACOL:1,UAPR:1,USPG:1,UPH:1,UTP:1,UGL:1,UKET:1,UBIL:1,UHGB:1,UNIT:1,UROB:1,ULEU:1,UEPI:1,UWBC:1,URBC:1,UBAC:1,CAST:1,CRYS:1,UCOM:1,BILUA:1 in the last 72 hours   STUDIES: No  new results found.  ASSESSMENT: A 41 year old Mandarin speaker currently residing in [place], status post left lumpectomy and sentinel lymph node biopsy in April 2008 for a T1, N0, stage IA, grade 2 invasive ductal carcinoma, strongly ER/PR positive, HER-2/neu negative with low MIB-1.  Treated adjuvantly with cyclophosphamide and docetaxel x4 followed by radiation therapy.  All of this was completed in October of 2008 at which time patient started tamoxifen.    PLAN: We discussed continuing tamoxifen in another 5 years, although with her very good prognosis to start with I really think this would be overkill. She very much would like to stop the medication and so we are proceeding  to do that. I was ready to discharge her from followup but she tells me she does not have a primary care physician at this time. Accordingly I have set her up for mammography in April, with lab work including a lipid panel, and she will see [place] again in may of next year. We will continue to see her on a once a year basis until she establishes herself with her primary care physician. She knows to call for any problems that may develop before the next visit.   MAGRINAT,GUSTAV C    11/07/2011

## 2012-05-02 ENCOUNTER — Ambulatory Visit
Admission: RE | Admit: 2012-05-02 | Discharge: 2012-05-02 | Disposition: A | Discharge status: Home / Self Care | Payer: BC Managed Care – PPO | Source: Ambulatory Visit | Attending: Oncology | Admitting: Oncology

## 2012-05-02 ENCOUNTER — Other Ambulatory Visit (HOSPITAL_BASED_OUTPATIENT_CLINIC_OR_DEPARTMENT_OTHER): Payer: BC Managed Care – PPO | Admitting: Lab

## 2012-05-02 DIAGNOSIS — C50919 Malignant neoplasm of unspecified site of unspecified female breast: Secondary | ICD-10-CM

## 2012-05-02 DIAGNOSIS — C50419 Malignant neoplasm of upper-outer quadrant of unspecified female breast: Secondary | ICD-10-CM

## 2012-05-02 LAB — CBC WITH DIFFERENTIAL/PLATELET
BASO%: 0.6 % (ref 0.0–2.0)
LYMPH%: 35 % (ref 14.0–49.7)
MCHC: 33.2 g/dL (ref 31.5–36.0)
MONO#: 0.4 10*3/uL (ref 0.1–0.9)
MONO%: 8.3 % (ref 0.0–14.0)
NEUT#: 2.9 10*3/uL (ref 1.5–6.5)
Platelets: 263 10*3/uL (ref 145–400)
RBC: 4.11 10*6/uL (ref 3.70–5.45)
RDW: 13.2 % (ref 11.2–14.5)
WBC: 5.3 10*3/uL (ref 3.9–10.3)
nRBC: 0 % (ref 0–0)

## 2012-05-02 LAB — COMPREHENSIVE METABOLIC PANEL (CC13)
ALT: 24 U/L (ref 0–55)
AST: 25 U/L (ref 5–34)
Chloride: 106 mEq/L (ref 98–107)
Creatinine: 0.8 mg/dL (ref 0.6–1.1)
Sodium: 140 mEq/L (ref 136–145)
Total Bilirubin: 0.57 mg/dL (ref 0.20–1.20)
Total Protein: 6.7 g/dL (ref 6.4–8.3)

## 2012-05-02 LAB — LIPID PANEL
Total CHOL/HDL Ratio: 3.6 Ratio
VLDL: 17 mg/dL (ref 0–40)

## 2012-05-12 ENCOUNTER — Ambulatory Visit: Payer: Self-pay

## 2012-05-12 ENCOUNTER — Ambulatory Visit (INDEPENDENT_AMBULATORY_CARE_PROVIDER_SITE_OTHER): Payer: BC Managed Care – PPO | Admitting: Family Medicine

## 2012-05-12 ENCOUNTER — Other Ambulatory Visit: Payer: Self-pay | Admitting: Family Medicine

## 2012-05-12 VITALS — BP 109/69 | HR 90 | Temp 99.0°F | Resp 16 | Ht 60.5 in | Wt 126.0 lb

## 2012-05-12 DIAGNOSIS — Z0000 Encounter for general adult medical examination without abnormal findings: Secondary | ICD-10-CM

## 2012-05-12 DIAGNOSIS — Z01419 Encounter for gynecological examination (general) (routine) without abnormal findings: Secondary | ICD-10-CM

## 2012-05-12 LAB — POCT URINALYSIS DIPSTICK
Bilirubin, UA: NEGATIVE
Ketones, UA: NEGATIVE
Leukocytes, UA: NEGATIVE
Protein, UA: NEGATIVE
pH, UA: 7

## 2012-05-12 LAB — POCT UA - MICROSCOPIC ONLY
Casts, Ur, LPF, POC: NEGATIVE
Crystals, Ur, HPF, POC: NEGATIVE

## 2012-05-12 LAB — IFOBT (OCCULT BLOOD): IFOBT: NEGATIVE

## 2012-05-12 NOTE — Progress Notes (Signed)
[address]   [place], [place]  [address]   [phone]  Subjective:    Patient ID: Sierra Mendoza, female    DOB: 10/16/1970, 42 y.o.   MRN: [id number]  HPI This 42 y.o. female presents for CPE.  Last physical 04/2011.   Pap smear 04/2011. Mammogram 05/02/2012. Normal. Colonoscopy never. Tetanus vaccine 2008. Flu vaccine never. Eye exam never; +glasses. Dental exam 2012.    Review of Systems  Constitutional: Negative for fever, chills, diaphoresis, activity change, appetite change, fatigue and unexpected weight change.  HENT: Negative for hearing loss, ear pain, nosebleeds, congestion, sore throat, facial swelling, rhinorrhea, sneezing, neck pain, neck stiffness, voice change, postnasal drip, sinus pressure, tinnitus and ear discharge.   Eyes: Negative for photophobia, pain, discharge, redness, itching and visual disturbance.  Respiratory: Negative for cough, shortness of breath, wheezing and stridor.   Cardiovascular: Negative for chest pain, palpitations and leg swelling.  Gastrointestinal: Negative for nausea, vomiting, abdominal pain, diarrhea, constipation, blood in stool, abdominal distention, anal bleeding and rectal pain.  Endocrine: Negative for cold intolerance, heat intolerance, polydipsia, polyphagia and polyuria.  Genitourinary: Negative for dysuria, urgency, frequency, hematuria, flank pain, vaginal bleeding, vaginal discharge, genital sores, menstrual problem and pelvic pain.  Musculoskeletal: Negative for myalgias, back pain, joint swelling, arthralgias and gait problem.  Skin: Negative for color change, pallor, rash and wound.  Allergic/Immunologic: Negative for environmental allergies, food allergies and immunocompromised state.  Neurological: Positive for headaches. Negative for dizziness, tremors, seizures, syncope, facial asymmetry, speech difficulty, weakness, light-headedness and numbness.  Hematological: Negative for adenopathy. Does not bruise/bleed easily.    Psychiatric/Behavioral: Negative for suicidal ideas, hallucinations, behavioral problems, confusion, sleep disturbance, self-injury, dysphoric mood and decreased concentration. The patient is not nervous/anxious and is not hyperactive.         Past Medical History  Diagnosis Date  . Cancer     Left Breast cancer; lumpectomy with radiation; chemotherapy.    Past Surgical History  Procedure Laterality Date  . Breast surgery      s/p L lumpectomy for breast cancer followed by radiation, chemotherapy.  . Cesarean section      2 c sections    Prior to Admission medications   Medication Sig Start Date End Date Taking? Authorizing Provider  tamoxifen (NOLVADEX) 20 MG tablet Take 1 tablet (20 mg total) by mouth daily. 09/06/11   [name], MD    No Known Allergies  History   Social History  . Marital Status: Married    Spouse Name: N/A    Number of Children: N/A  . Years of Education: N/A   Occupational History  . Not on file.   Social History Main Topics  . Smoking status: Never Smoker   . Smokeless tobacco: Not on file  . Alcohol Use: No  . Drug Use: Not on file  . Sexually Active: Yes   Other Topics Concern  . Not on file   Social History Narrative   Marital status: married x 18 years.      Children: three daughters (28, 46, 42)      Lives: with husband, 3 daughters.  From  [place]; moved to [place] 1991.      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      Tobacco: none      Alcohol: none      Drugs: none      Exercise:  Running four days per week; 30 minutes.      Seatbelt:  100%          Family History  Problem Relation Age of Onset  . Hypertension Father     Objective:   Physical Exam  Nursing note and vitals reviewed. Constitutional: She is oriented to person, place, and time. She appears well-developed and well-nourished. No distress.  HENT:  Head: Normocephalic and atraumatic.  Right Ear: External ear normal.  Left Ear: External ear  normal.  Nose: Nose normal.  Mouth/Throat: Oropharynx is clear and moist.  Eyes: Conjunctivae and EOM are normal. Pupils are equal, round, and reactive to light.  Neck: Normal range of motion. Neck supple. No thyromegaly present.  Cardiovascular: Normal rate, regular rhythm, normal heart sounds and intact distal pulses.  Exam reveals no gallop and no friction rub.   No murmur heard. Pulmonary/Chest: Effort normal and breath sounds normal. She has no wheezes. She has no rales.  Abdominal: Soft. Bowel sounds are normal. She exhibits no distension and no mass. There is no tenderness. There is no rebound and no guarding.  Genitourinary: Rectum normal, vagina normal and uterus normal. No breast swelling, tenderness, discharge or bleeding. There is no rash, tenderness, lesion or injury on the right labia. There is no rash, tenderness, lesion or injury on the left labia. Cervix exhibits discharge. Cervix exhibits no motion tenderness. Right adnexum displays no mass, no tenderness and no fullness. Left adnexum displays no mass, no tenderness and no fullness.  L BREAST:  LOWER OUTER QUADRANT WITH PALPABLE AREA 1 CM MOBILE.  Lymphadenopathy:    She has no cervical adenopathy.  Neurological: She is alert and oriented to person, place, and time. No cranial nerve deficit. She exhibits normal muscle tone. Coordination normal.  Skin: Skin is warm and dry. No rash noted. She is not diaphoretic.  Psychiatric: She has a normal mood and affect. Her behavior is normal. Thought content normal.   Results for orders placed in visit on 05/12/12  IFOBT (OCCULT BLOOD)      Result Value Range   IFOBT Negative    POCT UA - MICROSCOPIC ONLY      Result Value Range   WBC, Ur, HPF, POC 0-1     RBC, urine, microscopic 0-3     Bacteria, U Microscopic 1+     Mucus, UA neg     Epithelial cells, urine per micros 2-3     Crystals, Ur, HPF, POC neg     Casts, Ur, LPF, POC neg     Yeast, UA neg     Amorphous pos    POCT  URINALYSIS DIPSTICK      Result Value Range   Color, UA yellow     Clarity, UA clear     Glucose, UA 100     Bilirubin, UA neg     Ketones, UA neg     Spec Grav, UA 1.020     Blood, UA large     pH, UA 7.0     Protein, UA neg     Urobilinogen, UA 0.2     Nitrite, UA neg     Leukocytes, UA Negative          Assessment & Plan:  Routine general medical examination at a health care facility - Plan: IFOBT POC (occult bld, rslt in office), POCT UA - Microscopic Only, POCT urinalysis dipstick  Routine gynecological examination - Plan: Pap IG and HPV (high risk) DNA detection   1. CPE:  Anticipatory guidance --- weight maintenance, continued exercise.  Pap smear  obtained; mammogram UTD; hemosure negative.  Obtain labs. 2.  Gynecological exam: performed; pap smear obtained; mammogram UTD. 3.  Breast Cancer: stable; followed regularly by oncology.  Maintained on Tamoxifen.

## 2012-05-13 LAB — TSH: TSH: 2.318 u[IU]/mL (ref 0.350–4.500)

## 2012-05-13 LAB — VITAMIN B12: Vitamin B-12: 444 pg/mL (ref 211–911)

## 2012-05-14 ENCOUNTER — Other Ambulatory Visit: Payer: Self-pay | Admitting: Lab

## 2012-05-14 LAB — VITAMIN D 25 HYDROXY (VIT D DEFICIENCY, FRACTURES): Vit D, 25-Hydroxy: 18 ng/mL — ABNORMAL LOW (ref 30–89)

## 2012-05-15 LAB — PAP IG AND HPV HIGH-RISK: HPV DNA High Risk: NOT DETECTED

## 2012-06-06 ENCOUNTER — Other Ambulatory Visit: Payer: Self-pay | Admitting: Lab

## 2012-06-09 ENCOUNTER — Encounter: Payer: Self-pay | Admitting: *Deleted

## 2012-06-14 ENCOUNTER — Encounter: Payer: Self-pay | Admitting: Physician Assistant

## 2012-06-14 ENCOUNTER — Ambulatory Visit (HOSPITAL_BASED_OUTPATIENT_CLINIC_OR_DEPARTMENT_OTHER): Payer: BC Managed Care – PPO | Admitting: Physician Assistant

## 2012-06-14 VITALS — BP 108/71 | HR 67 | Temp 98.1°F | Resp 20 | Ht 60.0 in | Wt 125.4 lb

## 2012-06-14 DIAGNOSIS — C50912 Malignant neoplasm of unspecified site of left female breast: Secondary | ICD-10-CM

## 2012-06-14 DIAGNOSIS — Z853 Personal history of malignant neoplasm of breast: Secondary | ICD-10-CM

## 2012-06-14 NOTE — Progress Notes (Signed)
ID: Sierra Mendoza   DOB: March 04, 1970  MR#: [id number]  CSN#:[id number]  HISTORY OF PRESENT ILLNESS: The patient herself palpated a mass in her left breast.  I believe she brought this to Dr. Molly Maduro Doolittle's attention and he set her up for mammography on 04/04/2006.  This indeed confirmed the presence of a possible mass in the left breast and diagnostic mammography on 04/11/2006 confirmed the presence of an ill-defined density in the left breast, which by ultrasound was irregular and hypoechoic, measuring up to 1.8 cm.  The mass was biopsied the same day and this showed ([id number] and [id number]) an invasive ductal carcinoma, which was 99% positive for the estrogen receptor, 100% positive for the progesterone receptor, with a low proliferation marker at 13%, negative for HER-2 at 1+.    With this information, the patient was referred to Dr. Earlene Plater and on 04/19/2006, bilateral breast MRI were obtained.  This showed only the solitary area of enhancement in the left breast, which measured  2.0 cm by MRI.  There was a 2.8-cm non-enhancing mass in the right hepatic lobe, felt most likely to be a cyst.  With this information, after appropriate discussion, the patient proceeded to definitive left lumpectomy and sentinel lymph node biopsy under [facility], 05/01/2006.  The final pathology ([id number]) showed a 2.0-cm infiltrating ductal carcinoma, grade 2, with negative margins and no evidence of lymphovascular invasion.  The sentinel lymph node was negative.  INTERVAL HISTORY:  The patient returns today for followup of her left breast cancer. The interval history is generally unremarkable. Her family is doing well. She is feeling well with no new complaints, and has established her self with Dr.  Lestine Mount for primary care.  Sierra Mendoza discontinued her tamoxifen in October. She continues to deny any problems with hot flashes. She is still having menstrual cycles although they are a little more irregular.   REVIEW OF SYSTEMS:   Sierra Mendoza denies any recent illnesses and has had no fevers or chills. She denies any rashes, skin changes, abnormal bruising, or bleeding. Her appetite is good she denies any nausea or change in bowel or bladder habits. She's had no cough, increased shortness of breath, chest pain, or palpitations. No abnormal headaches or dizziness. No myalgias, arthralgias, bony pain, or peripheral swelling.  A detailed review of systems is otherwise noncontributory today.   PAST MEDICAL HISTORY: Past Medical History  Diagnosis Date  . Cancer     Left Breast cancer; lumpectomy with radiation; chemotherapy.    PAST SURGICAL HISTORY: S/p cesarean section  FAMILY HISTORY The patient's parents are alive The patient has 2 brothers.  There is no history of breast or ovarian cancer in the family.  GYNECOLOGIC HISTORY: She is GX, P3, first pregnancy age 80, she is still menstruating regularly, although periods are now scant   SOCIAL HISTORY: She and her husband, [name], run the [employer] on [address], close to [employer].  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  They have 3 daughters, the oldest currently 67.  The patient's brother is also in [place].     ADVANCED DIRECTIVES:  HEALTH MAINTENANCE: History  Substance Use Topics  . Smoking status: Never Smoker   . Smokeless tobacco: Never Used  . Alcohol Use: No     Colonoscopy:  PAP: scheduled for 2/13  Bone density:  Lipid panel:  No Known Allergies  Current Outpatient Prescriptions  Medication Sig Dispense Refill  . tamoxifen (NOLVADEX)  20 MG tablet Take 1 tablet (20 mg total) by mouth daily.  90 tablet  PRN   No current facility-administered medications for this visit.    OBJECTIVE: Early middle age Asian woman who appears well Filed Vitals:   06/14/12 0910  BP: 108/71  Pulse: 67  Temp: 98.1 F (36.7 C)  Resp: 20     Body mass index is 24.49 kg/(m^2).    ECOG FS: 0 Filed  Weights   06/14/12 0910  Weight: 125 lb 6.4 oz (56.881 kg)   Sclerae unicteric Oropharynx clear No peripheral adenopathy Lungs clear to auscultation bilaterally, no wheezes, no rales or rhonchi Heart regular rate and rhythm Abdomen soft, nontender, positive bowel sounds MSK no focal spinal tenderness to vigorous palpation and percussion, no peripheral edema; Neuro: nonfocal, well oriented, positive affect Breasts:  right breast is unremarkable; left breast status post lumpectomy and radiation; no evidence of local recurrence; axillae are benign bilaterally with no palpable adenopathy noted.  LAB RESULTS: Lab Results  Component Value Date   WBC 5.3 05/02/2012   NEUTROABS 2.9 05/02/2012   HGB 12.1 05/02/2012   HCT 36.5 05/02/2012   MCV 88.8 05/02/2012   PLT 263 05/02/2012      Chemistry      Component Value Date/Time   NA 140 05/02/2012 0904   NA 142 03/07/2011 0901   K 4.0 05/02/2012 0904   K 4.1 03/07/2011 0901   CL 106 05/02/2012 0904   CL 108 03/07/2011 0901   CO2 25 05/02/2012 0904   CO2 27 03/07/2011 0901   BUN 9.8 05/02/2012 0904   BUN 15 03/07/2011 0901   CREATININE 0.8 05/02/2012 0904   CREATININE 0.68 03/07/2011 0901      Component Value Date/Time   CALCIUM 8.7 05/02/2012 0904   CALCIUM 9.2 03/07/2011 0901   ALKPHOS 67 05/02/2012 0904   ALKPHOS 42 03/07/2011 0901   AST 25 05/02/2012 0904   AST 19 03/07/2011 0901   ALT 24 05/02/2012 0904   ALT 10 03/07/2011 0901   BILITOT 0.57 05/02/2012 0904   BILITOT 0.2* 03/07/2011 0901       STUDIES:  Most recent mammogram on 05/02/2012 was unremarkable.   ASSESSMENT: A 42 year old Mandarin speaker currently residing in [place],  (1)   status post left lumpectomy and sentinel lymph node biopsy in April 2008 for a T1, N0, stage IA, grade 2 invasive ductal carcinoma, strongly ER/PR positive, HER-2/neu negative with low MIB-1.    (2)  Treated adjuvantly with cyclophosphamide and docetaxel x4 followed by radiation therapy.    (3)  All of this was  completed in October of 2008 at which time patient started tamoxifen, continuing the tamoxifen until October 2013.    PLAN: Sierra Mendoza is now 6 years out from her cancer diagnosis. She completed her 5 years of tamoxifen one year ago, and continues to do very well with regards to her breast cancer diagnosis, with no clinical evidence of disease recurrence. She is ready to "graduate" from followup at this time. She'll continue to be followed regularly by Dr. Nilda Simmer, her primary care physician. Of course she knows that we keep her records here for at least 10 years, and will call if she has any questions or problems whatsoever.    Riker Collier    06/14/2012

## 2013-02-27 ENCOUNTER — Other Ambulatory Visit: Payer: Self-pay | Admitting: Oncology

## 2013-02-27 DIAGNOSIS — Z9889 Other specified postprocedural states: Secondary | ICD-10-CM

## 2013-05-03 ENCOUNTER — Other Ambulatory Visit: Payer: Self-pay | Admitting: Oncology

## 2013-05-03 ENCOUNTER — Ambulatory Visit
Admission: RE | Admit: 2013-05-03 | Discharge: 2013-05-03 | Disposition: A | Discharge status: Home / Self Care | Payer: BC Managed Care – PPO | Source: Ambulatory Visit | Attending: Oncology | Admitting: Oncology

## 2013-05-03 ENCOUNTER — Ambulatory Visit
Admission: RE | Admit: 2013-05-03 | Discharge: 2013-05-03 | Disposition: A | Discharge status: Home / Self Care | Payer: Self-pay | Source: Ambulatory Visit | Attending: Oncology | Admitting: Oncology

## 2013-05-03 DIAGNOSIS — N6320 Unspecified lump in the left breast, unspecified quadrant: Secondary | ICD-10-CM

## 2013-05-03 DIAGNOSIS — Z9889 Other specified postprocedural states: Secondary | ICD-10-CM

## 2013-05-29 ENCOUNTER — Ambulatory Visit (INDEPENDENT_AMBULATORY_CARE_PROVIDER_SITE_OTHER): Payer: BC Managed Care – PPO | Admitting: Family Medicine

## 2013-05-29 VITALS — BP 108/76 | HR 83 | Temp 98.1°F | Resp 16 | Ht 60.0 in | Wt 129.4 lb

## 2013-05-29 DIAGNOSIS — Z131 Encounter for screening for diabetes mellitus: Secondary | ICD-10-CM

## 2013-05-29 DIAGNOSIS — E559 Vitamin D deficiency, unspecified: Secondary | ICD-10-CM

## 2013-05-29 DIAGNOSIS — Z13220 Encounter for screening for lipoid disorders: Secondary | ICD-10-CM

## 2013-05-29 DIAGNOSIS — Z0000 Encounter for general adult medical examination without abnormal findings: Secondary | ICD-10-CM

## 2013-05-29 LAB — POCT CBC
Granulocyte percent: 62.8 % (ref 37–80)
HCT, POC: 39.3 % (ref 37.7–47.9)
Hemoglobin: 12.7 g/dL (ref 12.2–16.2)
Lymph, poc: 1.3 (ref 0.6–3.4)
MCH, POC: 30 pg (ref 27–31.2)
MCHC: 32.3 g/dL (ref 31.8–35.4)
MCV: 92.8 fL (ref 80–97)
MID (cbc): 0.5 (ref 0–0.9)
MPV: 8 fL (ref 0–99.8)
POC Granulocyte: 3 (ref 2–6.9)
POC LYMPH PERCENT: 27.8 %L (ref 10–50)
POC MID %: 9.4 %M (ref 0–12)
Platelet Count, POC: 322 10*3/uL (ref 142–424)
RBC: 4.23 M/uL (ref 4.04–5.48)
RDW, POC: 14.1 %
WBC: 4.8 10*3/uL (ref 4.6–10.2)

## 2013-05-29 LAB — POCT URINALYSIS DIPSTICK
Bilirubin, UA: NEGATIVE
Glucose, UA: NEGATIVE
Nitrite, UA: NEGATIVE
Protein, UA: NEGATIVE
Spec Grav, UA: 1.02
Urobilinogen, UA: 0.2
pH, UA: 6

## 2013-05-29 LAB — IFOBT (OCCULT BLOOD): IFOBT: NEGATIVE

## 2013-05-29 LAB — LDL CHOLESTEROL, DIRECT: Direct LDL: 114 mg/dL — ABNORMAL HIGH

## 2013-05-29 LAB — POCT UA - MICROSCOPIC ONLY
Casts, Ur, LPF, POC: NEGATIVE
Crystals, Ur, HPF, POC: NEGATIVE
Yeast, UA: NEGATIVE

## 2013-05-29 LAB — TSH: TSH: 2.632 u[IU]/mL (ref 0.350–4.500)

## 2013-05-29 LAB — POCT GLYCOSYLATED HEMOGLOBIN (HGB A1C): Hemoglobin A1C: 5.6

## 2013-05-29 NOTE — Progress Notes (Signed)
Chief Complaint:  Chief Complaint  Patient presents with  . Annual Exam    need Pap Smear    HPI: Sierra Mendoza is a 43 y.o. female who is here for  Annual visit, wants pap since breast cancer hx She has G3L3 LMP was April 20 Last pap 2014, no prior abnormal paps, she would still like one since she states that she has heard that people with breast cancer should have one No other complaints. No one in her family has a a history of breast cancer   Past Medical History  Diagnosis Date  . Cancer     Left Breast cancer; lumpectomy with radiation; chemotherapy.   Past Surgical History  Procedure Laterality Date  . Breast surgery      s/p L lumpectomy for breast cancer followed by radiation, chemotherapy.  . Cesarean section      2 c sections   History   Social History  . Marital Status: Married    Spouse Name: N/A    Number of Children: N/A  . Years of Education: N/A   Social History Main Topics  . Smoking status: Never Smoker   . Smokeless tobacco: Never Used  . Alcohol Use: No  . Drug Use: None  . Sexual Activity: Yes   Other Topics Concern  . None   Social History Narrative   Marital status: married x 18 years.      Children: three daughters (27, 78, 19)      Lives: with husband, 3 daughters.  From  [place]; moved to [place] 1991.      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      Tobacco: none      Alcohol: none      Drugs: none      Exercise:  Running four days per week; 30 minutes.      Seatbelt:  100%         Family History  Problem Relation Age of Onset  . Hypertension Father    No Known Allergies Prior to Admission medications   Not on File     ROS: The patient denies fevers, chills, night sweats, unintentional weight loss, chest pain, palpitations, wheezing, dyspnea on exertion, nausea, vomiting, abdominal pain, dysuria, hematuria, melena, numbness, weakness, or tingling.   All other systems have been reviewed and were otherwise negative  with the exception of those mentioned in the HPI and as above.    PHYSICAL EXAM: Filed Vitals:   05/29/13 1313  BP: 108/76  Pulse: 83  Temp: 98.1 F (36.7 C)  Resp: 16   Filed Vitals:   05/29/13 1313  Height: 5' (1.524 m)  Weight: 129 lb 6.4 oz (58.695 kg)   Body mass index is 25.27 kg/(m^2).  General: Alert, no acute distress HEENT:  Normocephalic, atraumatic, oropharynx patent. EOMI, PERRLA, Tm nl, no exudates Cardiovascular:  Regular rate and rhythm, no rubs murmurs or gallops.  No Carotid bruits, radial pulse intact. No pedal edema.  Respiratory: Clear to auscultation bilaterally.  No wheezes, rales, or rhonchi.  No cyanosis, no use of accessory musculature GI: No organomegaly, abdomen is soft and non-tender, positive bowel sounds.  No masses. Skin: No rashes. Neurologic: Facial musculature symmetric. Psychiatric: Patient is appropriate throughout our interaction. Lymphatic: No cervical lymphadenopathy Musculoskeletal: Gait intact. Breast exam normal except for L lumpectomy scar GU-normal, cervix nl, + hemorrhoids  LABS: Results for orders placed in visit on 05/12/12  TSH  Result Value Ref Range   TSH 2.318  0.350 - 4.500 uIU/mL  VITAMIN B12      Result Value Ref Range   Vitamin B-12 444  211 - 911 pg/mL  VITAMIN D 25 HYDROXY      Result Value Ref Range   Vit D, 25-Hydroxy 18 (*) 30 - 89 ng/mL  IFOBT (OCCULT BLOOD)      Result Value Ref Range   IFOBT Negative    POCT UA - MICROSCOPIC ONLY      Result Value Ref Range   WBC, Ur, HPF, POC 0-1     RBC, urine, microscopic 0-3     Bacteria, U Microscopic 1+     Mucus, UA neg     Epithelial cells, urine per micros 2-3     Crystals, Ur, HPF, POC neg     Casts, Ur, LPF, POC neg     Yeast, UA neg     Amorphous pos    POCT URINALYSIS DIPSTICK      Result Value Ref Range   Color, UA yellow     Clarity, UA clear     Glucose, UA 100     Bilirubin, UA neg     Ketones, UA neg     Spec Grav, UA 1.020     Blood,  UA large     pH, UA 7.0     Protein, UA neg     Urobilinogen, UA 0.2     Nitrite, UA neg     Leukocytes, UA Negative    GLUCOSE, POCT (MANUAL RESULT ENTRY)      Result Value Ref Range   POC Glucose 147 (*) 70 - 99 mg/dl  POCT GLYCOSYLATED HEMOGLOBIN (HGB A1C)      Result Value Ref Range   Hemoglobin A1C 5.9       EKG/XRAY:   Primary read interpreted by Dr. Marin Comment at [facility].   ASSESSMENT/PLAN: Encounter Diagnoses  Name Primary?  . Annual physical exam Yes  . Screening for hyperlipidemia   . Screening for diabetes mellitus   . Unspecified vitamin D deficiency    Annual labs pending, patient already ate today F/u in 1 year We had a discussion about pap smears and the new standards for interval screening, she would still like one today due to her breast cancer history, although I told her that I do not think there is a correlation If pap is normal then consider getting pap in 3 years  Gross sideeffects, risk and benefits, and alternatives of medications d/w patient. Patient is aware that all medications have potential sideeffects and we are unable to predict every sideeffect or drug-drug interaction that may occur.  Glenford Bayley, DO 05/29/2013 2:18 PM

## 2013-05-30 LAB — PAP IG W/ RFLX HPV ASCU

## 2013-05-30 LAB — VITAMIN D 25 HYDROXY (VIT D DEFICIENCY, FRACTURES): Vit D, 25-Hydroxy: 36 ng/mL (ref 30–89)

## 2013-06-06 ENCOUNTER — Encounter: Payer: Self-pay | Admitting: Family Medicine

## 2014-01-24 HISTORY — PX: BREAST BIOPSY: SHX20

## 2014-03-06 ENCOUNTER — Encounter: Payer: Self-pay | Admitting: Nurse Practitioner

## 2014-03-06 ENCOUNTER — Ambulatory Visit (INDEPENDENT_AMBULATORY_CARE_PROVIDER_SITE_OTHER): Payer: BLUE CROSS/BLUE SHIELD | Admitting: Nurse Practitioner

## 2014-03-06 ENCOUNTER — Ambulatory Visit: Payer: Self-pay | Admitting: Nurse Practitioner

## 2014-03-06 VITALS — BP 112/80 | HR 72 | Temp 98.3°F | Ht 60.0 in | Wt 124.0 lb

## 2014-03-06 DIAGNOSIS — B373 Candidiasis of vulva and vagina: Secondary | ICD-10-CM

## 2014-03-06 DIAGNOSIS — C50919 Malignant neoplasm of unspecified site of unspecified female breast: Secondary | ICD-10-CM

## 2014-03-06 DIAGNOSIS — B3731 Acute candidiasis of vulva and vagina: Secondary | ICD-10-CM | POA: Insufficient documentation

## 2014-03-06 MED ORDER — MICONAZOLE NITRATE 100 MG VA SUPP: 100.0000 mg | Freq: Every day | VAGINAL | Status: DC

## 2014-03-06 NOTE — Progress Notes (Signed)
Pre visit review using our clinic review tool, if applicable. No additional management support is needed unless otherwise documented below in the visit note. 

## 2014-03-06 NOTE — Patient Instructions (Signed)
Please start vaginal suppositories. Apply at bedtime & leave in.  Return in 10 days. We will perform Pap smear & blood work in May. Very nice to meet You! Monilial Vaginitis Vaginitis in a soreness, swelling and redness (inflammation) of the vagina and vulva. Monilial vaginitis is not a sexually transmitted infection. CAUSES  Yeast vaginitis is caused by yeast (candida) that is normally found in your vagina. With a yeast infection, the candida has overgrown in number to a point that upsets the chemical balance. SYMPTOMS   White, thick vaginal discharge.  Swelling, itching, redness and irritation of the vagina and possibly the lips of the vagina (vulva).  Burning or painful urination.  Painful intercourse. DIAGNOSIS  Things that may contribute to monilial vaginitis are:  Postmenopausal and virginal states.  Pregnancy.  Infections.  Being tired, sick or stressed, especially if you had monilial vaginitis in the past.  Diabetes. Good control will help lower the chance.  Birth control pills.  Tight fitting garments.  Using bubble bath, feminine sprays, douches or deodorant tampons.  Taking certain medications that kill germs (antibiotics).  Sporadic recurrence can occur if you become ill. TREATMENT  Your caregiver will give you medication.  There are several kinds of anti monilial vaginal creams and suppositories specific for monilial vaginitis. For recurrent yeast infections, use a suppository or cream in the vagina 2 times a week, or as directed.  Anti-monilial or steroid cream for the itching or irritation of the vulva may also be used. Get your caregiver's permission.  Painting the vagina with methylene blue solution may help if the monilial cream does not work.  Eating yogurt may help prevent monilial vaginitis. HOME CARE INSTRUCTIONS   Finish all medication as prescribed.  Do not have sex until treatment is completed or after your caregiver tells you it is  okay.  Take warm sitz baths.  Do not douche.  Do not use tampons, especially scented ones.  Wear cotton underwear.  Avoid tight pants and panty hose.  Tell your sexual partner that you have a yeast infection. They should go to their caregiver if they have symptoms such as mild rash or itching.  Your sexual partner should be treated as well if your infection is difficult to eliminate.  Practice safer sex. Use condoms.  Some vaginal medications cause latex condoms to fail. Vaginal medications that harm condoms are:  Cleocin cream.  Butoconazole (Femstat).  Terconazole (Terazol) vaginal suppository.  Miconazole (Monistat) (may be purchased over the counter). SEEK MEDICAL CARE IF:   You have a temperature by mouth above 102 F (38.9 C).  The infection is getting worse after 2 days of treatment.  The infection is not getting better after 3 days of treatment.  You develop blisters in or around your vagina.  You develop vaginal bleeding, and it is not your menstrual period.  You have pain when you urinate.  You develop intestinal problems.  You have pain with sexual intercourse. Document Released: 10/20/2004 Document Revised: 04/04/2011 Document Reviewed: 07/04/2008 [facility] Patient Information 2015 [place], [place]. This information is not intended to replace advice given to you by your health care provider. Make sure you discuss any questions you have with your health care provider.

## 2014-03-06 NOTE — Progress Notes (Signed)
   Subjective:    Patient ID: Sierra Mendoza, female    DOB: [birth year], 44 y.o.   MRN: [id number]  HPI Comments: Pt is Mandarin speaking with some english. She has interpreter with her today. Her daughter can read english, she lives with her.  Vaginal Itching The patient's primary symptoms include genital itching. The patient's pertinent negatives include no genital lesions, genital odor, genital rash, missed menses, pelvic pain, vaginal bleeding or vaginal discharge. This is a new problem. The current episode started 1 to 4 weeks ago (1 month). The problem occurs constantly. The problem has been gradually improving. The patient is experiencing no pain. She is not pregnant. Pertinent negatives include no abdominal pain, back pain, chills, constipation, diarrhea, discolored urine, dysuria, fever, flank pain, frequency, nausea, rash, urgency or vomiting. Nothing aggravates the symptoms. Treatments tried: hydrocortisone cream. The treatment provided mild relief. She is sexually active. No, her partner does not have an STD. Her menstrual history has been regular. (Breast cancer '07. Tx: chemo & radiation followed by tamoxifen until 2013.)      Review of Systems  Constitutional: Negative for fever, chills and fatigue.  Gastrointestinal: Negative for nausea, vomiting, abdominal pain, diarrhea and constipation.  Genitourinary: Negative for dysuria, urgency, frequency, flank pain, vaginal bleeding, vaginal discharge, vaginal pain, pelvic pain and missed menses.  Musculoskeletal: Negative for back pain.  Skin: Negative for rash.       Objective:   Physical Exam  Constitutional: She is oriented to person, place, and time. She appears well-developed and well-nourished. No distress.  HENT:  Head: Normocephalic and atraumatic.  Right Ear: External ear normal.  Left Ear: External ear normal.  Mouth/Throat: No oropharyngeal exudate.  Eyes: Conjunctivae are normal. Right eye exhibits no discharge. Left eye  exhibits no discharge.  Neck: Normal range of motion. Neck supple. No thyromegaly present.  Cardiovascular: Normal rate, regular rhythm and normal heart sounds.   No murmur heard. No carotid bruit   Pulmonary/Chest: Effort normal and breath sounds normal. No respiratory distress.  Abdominal: Soft. She exhibits no distension and no mass. There is no tenderness. There is no rebound and no guarding.  Lymphadenopathy:    She has no cervical adenopathy.  Neurological: She is alert and oriented to person, place, and time.  Skin: Skin is warm and dry.  Psychiatric: She has a normal mood and affect. Her behavior is normal. Thought content normal.  Vitals reviewed.         Assessment & Plan:  1. Vaginal yeast infection No vag exam. Treat based on symptoms - miconazole (MICOTIN) 100 MG vaginal suppository; Place 1 suppository (100 mg total) vaginally at bedtime.  Dispense: 7 suppository; Refill: 0 If no relief, will perform vagina exam w/ wet prep at ret OV

## 2014-03-18 ENCOUNTER — Ambulatory Visit: Payer: BLUE CROSS/BLUE SHIELD | Admitting: Nurse Practitioner

## 2014-03-30 ENCOUNTER — Ambulatory Visit (INDEPENDENT_AMBULATORY_CARE_PROVIDER_SITE_OTHER): Payer: BLUE CROSS/BLUE SHIELD | Admitting: Physician Assistant

## 2014-03-30 VITALS — BP 110/72 | HR 82 | Temp 98.0°F | Resp 16 | Ht 60.5 in | Wt 127.1 lb

## 2014-03-30 DIAGNOSIS — K649 Unspecified hemorrhoids: Secondary | ICD-10-CM | POA: Diagnosis not present

## 2014-03-30 DIAGNOSIS — K625 Hemorrhage of anus and rectum: Secondary | ICD-10-CM

## 2014-03-30 DIAGNOSIS — L708 Other acne: Secondary | ICD-10-CM | POA: Diagnosis not present

## 2014-03-30 DIAGNOSIS — R42 Dizziness and giddiness: Secondary | ICD-10-CM

## 2014-03-30 DIAGNOSIS — R5383 Other fatigue: Secondary | ICD-10-CM

## 2014-03-30 DIAGNOSIS — R351 Nocturia: Secondary | ICD-10-CM

## 2014-03-30 DIAGNOSIS — K068 Other specified disorders of gingiva and edentulous alveolar ridge: Secondary | ICD-10-CM

## 2014-03-30 LAB — POCT URINALYSIS DIPSTICK
Bilirubin, UA: NEGATIVE
Glucose, UA: NEGATIVE
Ketones, UA: NEGATIVE
Nitrite, UA: NEGATIVE
Protein, UA: NEGATIVE
Spec Grav, UA: <=1.005
UROBILINOGEN UA: 0.2
pH, UA: 6.5

## 2014-03-30 LAB — POCT CBC
GRANULOCYTE PERCENT: 57.4 % (ref 37–80)
HCT, POC: 41 % (ref 37.7–47.9)
Hemoglobin: 13 g/dL (ref 12.2–16.2)
Lymph, poc: 2.5 (ref 0.6–3.4)
MCH, POC: 29.8 pg (ref 27–31.2)
MCHC: 31.8 g/dL (ref 31.8–35.4)
MCV: 93.7 fL (ref 80–97)
MID (CBC): 0.3 (ref 0–0.9)
MPV: 6.5 fL (ref 0–99.8)
PLATELET COUNT, POC: 323 10*3/uL (ref 142–424)
POC Granulocyte: 3.8 (ref 2–6.9)
POC LYMPH %: 37.7 % (ref 10–50)
POC MID %: 4.9 %M (ref 0–12)
RBC: 4.38 M/uL (ref 4.04–5.48)
RDW, POC: 16.7 %
WBC: 6.6 10*3/uL (ref 4.6–10.2)

## 2014-03-30 LAB — POCT UA - MICROSCOPIC ONLY
Bacteria, U Microscopic: NEGATIVE
Casts, Ur, LPF, POC: NEGATIVE
Crystals, Ur, HPF, POC: NEGATIVE
Mucus, UA: NEGATIVE
RBC, urine, microscopic: NEGATIVE
WBC, Ur, HPF, POC: NEGATIVE
Yeast, UA: NEGATIVE

## 2014-03-30 LAB — GLUCOSE, POCT (MANUAL RESULT ENTRY): POC Glucose: 101 mg/dL — AB (ref 70–99)

## 2014-03-30 MED ORDER — HYDROCORTISONE ACETATE 25 MG RE SUPP: 25.0000 mg | Freq: Two times a day (BID) | RECTAL | Status: DC

## 2014-03-30 MED ORDER — TRETINOIN 0.1 % EX CREA: TOPICAL_CREAM | Freq: Every day | CUTANEOUS | Status: DC

## 2014-03-30 NOTE — Progress Notes (Signed)
Patient ID: Sierra Mendoza, female    DOB: 22-Sep-1970, 44 y.o.   MRN: [id number]  PCP: Irene Pap, NP  Subjective:   Chief Complaint  Patient presents with  . Breaking Out on Face  . Blood In Stools  . Dizziness  . sore on Gum  . Remove IUD    HPI  1. Acne of face. Break out 1-2 months. Using cucumbers applied topically. Shiseido cleanser, per her usual routine. Last acne problems about 20 years ago. As a youngster, had pretty bad acne. Her family was poor and couldn't afford treatment.  2. A "gum pimple." Present x 1 month. No pain. Feels hard. Has had this previously, but it resolved completely. It has been "a long time" since the last episode. She doesn't have a dentist and does not receive routine dental care.  3. Blood in stools.  BRBPR. "Very red." Not in the stool itself, but ON the stool and and tissue when she wipes. Sometimes she has constipation and has to strain, then it's painful. Other times the stool is soft. This happened a long time ago for a couple of days, but last week it occurred every day.  None x 2 days.  3. Dizziness x 2-3 weeks. Occurs with rapid position changes while she's busy at work. Also no energy x 1 month. Always wants to sleep. She gets "major headaches and migraines." RIGHT sided-headache, no benefit with aspirin. Lack of sleep triggers HA. Notes that she isn't sleeping well lately, but isn't sure why. Gets up at least twice each night to urinate.  4. Desires IUD removal due to nearing 10 years in place. Placed in 2007.  Placed in [place]. No problems with it, and does not desire pregnancy. Was treated for a vaginal yeast infection last month by her PCP.    Review of Systems Review of Systems  Constitutional: Positive for fatigue. Negative for fever and unexpected weight change.  HENT: Positive for mouth sores. Negative for sore throat, trouble swallowing and voice change.   Eyes: Negative.   Respiratory: Negative.   Cardiovascular: Negative for chest  pain, palpitations and leg swelling.  Gastrointestinal: Positive for constipation, anal bleeding and rectal pain. Negative for nausea, vomiting, abdominal pain, diarrhea and blood in stool.  Endocrine: Positive for polydipsia and polyuria. Negative for cold intolerance, heat intolerance and polyphagia.  Genitourinary: Negative for urgency, frequency, hematuria, vaginal discharge and pelvic pain.  Musculoskeletal: Negative.   Skin: Negative.   Allergic/Immunologic: Negative.   Neurological: Positive for dizziness, light-headedness and headaches. Negative for syncope, weakness and numbness.  Hematological: Negative.   Psychiatric/Behavioral: Positive for sleep disturbance. Negative for confusion, dysphoric mood and decreased concentration. The patient is not nervous/anxious.       Patient Active Problem List   Diagnosis Date Noted  . Breast cancer 03/16/2011     Prior to Admission medications   Medication Sig Start Date End Date Taking? Authorizing Provider  Cholecalciferol (VITAMIN D PO) Take by mouth daily.   Yes Historical Provider, MD  PARAGARD INTRAUTERINE COPPER IU by Intrauterine route.   Yes Historical Provider, MD     No Known Allergies     Objective:  Physical Exam  Physical Exam  Constitutional: She is oriented to person, place, and time. Vital signs are normal. She appears well-developed and well-nourished. She is active and cooperative. No distress.  BP 110/72 mmHg  Pulse 82  Temp(Src) 98 F (36.7 C) (Oral)  Resp 16  Ht 5' 0.5" (1.537 m)  Wt 127 lb 2 oz (57.664 kg)  BMI 24.41 kg/m2  SpO2 100%  LMP 03/10/2014  HENT:  Head: Normocephalic and atraumatic.  Right Ear: Hearing, tympanic membrane, external ear and ear canal normal.  Left Ear: Hearing, tympanic membrane, external ear and ear canal normal.  Nose: Nose normal.  Mouth/Throat: Uvula is midline and oropharynx is clear and moist. Oral lesions present. Abnormal dentition: fair repair. No oropharyngeal  exudate.    Eyes: Conjunctivae, EOM and lids are normal. Pupils are equal, round, and reactive to light. Right eye exhibits no discharge. Left eye exhibits no discharge. No scleral icterus.  Neck: Normal range of motion, full passive range of motion without pain and phonation normal. Neck supple. No thyromegaly present.  Cardiovascular: Normal rate, regular rhythm, normal heart sounds and normal pulses.   Pulses:      Radial pulses are 2+ on the right side, and 2+ on the left side.  Pulmonary/Chest: Effort normal and breath sounds normal.  Abdominal: Soft. Normal appearance and bowel sounds are normal. There is no hepatosplenomegaly. There is no tenderness. There is no CVA tenderness.  Genitourinary: Rectal exam shows external hemorrhoid.  Lymphadenopathy:       Head (right side): No tonsillar, no preauricular, no posterior auricular and no occipital adenopathy present.       Head (left side): No tonsillar, no preauricular, no posterior auricular and no occipital adenopathy present.    She has no cervical adenopathy.       Right: No supraclavicular adenopathy present.       Left: No supraclavicular adenopathy present.  Neurological: She is alert and oriented to person, place, and time. She has normal strength. No cranial nerve deficit or sensory deficit.  Skin: Skin is warm, dry and intact. Lesion (scattered papulopustular lesions on the face and upper neck consistent with acne) noted. No rash noted. No cyanosis or erythema. Nails show no clubbing.  Psychiatric: She has a normal mood and affect. Her speech is normal and behavior is normal.       Results for orders placed or performed in visit on 03/30/14  POCT CBC  Result Value Ref Range   WBC 6.6 4.6 - 10.2 K/uL   Lymph, poc 2.5 0.6 - 3.4   POC LYMPH PERCENT 37.7 10 - 50 %L   MID (cbc) 0.3 0 - 0.9   POC MID % 4.9 0 - 12 %M   POC Granulocyte 3.8 2 - 6.9   Granulocyte percent 57.4 37 - 80 %G   RBC 4.38 4.04 - 5.48 M/uL   Hemoglobin  13.0 12.2 - 16.2 g/dL   HCT, POC 41.0 37.7 - 47.9 %   MCV 93.7 80 - 97 fL   MCH, POC 29.8 27 - 31.2 pg   MCHC 31.8 31.8 - 35.4 g/dL   RDW, POC 16.7 %   Platelet Count, POC 323 142 - 424 K/uL   MPV 6.5 0 - 99.8 fL  POCT glucose (manual entry)  Result Value Ref Range   POC Glucose 101 (A) 70 - 99 mg/dl  POCT UA - Microscopic Only  Result Value Ref Range   WBC, Ur, HPF, POC neg    RBC, urine, microscopic neg    Bacteria, U Microscopic neg    Mucus, UA neg    Epithelial cells, urine per micros 0-2    Crystals, Ur, HPF, POC neg    Casts, Ur, LPF, POC neg    Yeast, UA neg   POCT urinalysis dipstick  Result Value Ref Range   Color, UA pale yellow    Clarity, UA clear    Glucose, UA neg    Bilirubin, UA neg    Ketones, UA neg    Spec Grav, UA <=1.005    Blood, UA trace-lysed    pH, UA 6.5    Protein, UA neg    Urobilinogen, UA 0.2    Nitrite, UA neg    Leukocytes, UA small (1+)        Assessment & Plan:  1. Other acne Continue daily cleansing with her current product. Add tretinoin at HS. - tretinoin (RETIN-A) 0.1 % cream; Apply topically at bedtime.  Dispense: 45 g; Refill: 0  2. Gum lesion Uncertain etiology. Advise evaluation with dental specialist. May require biopsy/excision. Needs routine dental care as well.  3. BRBPR (bright red blood per rectum) 4. Hemorrhoids, unspecified hemorrhoid type Likely hemorrhoidal bleeding with recent constipation. If no improvement with treatment, will need GI evaluation. - hydrocortisone (ANUSOL-HC) 25 MG suppository; Place 1 suppository (25 mg total) rectally 2 (two) times daily.  Dispense: 12 suppository; Refill: 0  5. Dizziness Await remaining labs.  Likely Benign positional vertigo. - POCT UA - Microscopic Only - POCT urinalysis dipstick  6. Other fatigue Await remaining labs. - POCT CBC - POCT glucose (manual entry) - Comprehensive metabolic panel - TSH  7. Nocturia 1+ leucocytes. If negative, consider over-active  bladder. May need fluid restriction in the evenings. - Urine culture   Fara Chute, PA-C Physician Assistant-Certified [facility]

## 2014-03-30 NOTE — Patient Instructions (Addendum)
1. I will contact you with your lab results as soon as they are available.   If you have not heard from me in 2 weeks, please contact me.  The fastest way to get your results is to register for My Chart (see the instructions on the last page of this printout).  2. Please schedule an appointment with a dentist to evaluate the lesions on the gums.  3. If the bleeding from the rectum persists, please let me or your primary care provider, Nicky Pugh, NP know. You may need evaluation with a gastroenterologist.  4. It's so important that you eat a healthy diet, get regular exercise, drink plenty of fluids and get adequate sleep.  5. Once the other things are resolved/improved, then the IUD can be removed.

## 2014-03-31 LAB — COMPREHENSIVE METABOLIC PANEL
ALT: 23 U/L (ref 0–35)
AST: 19 U/L (ref 0–37)
Albumin: 4.4 g/dL (ref 3.5–5.2)
Alkaline Phosphatase: 61 U/L (ref 39–117)
BUN: 12 mg/dL (ref 6–23)
CO2: 23 mEq/L (ref 19–32)
CREATININE: 0.74 mg/dL (ref 0.50–1.10)
Calcium: 9.1 mg/dL (ref 8.4–10.5)
Chloride: 105 mEq/L (ref 96–112)
Glucose, Bld: 91 mg/dL (ref 70–99)
Potassium: 3.8 mEq/L (ref 3.5–5.3)
Sodium: 136 mEq/L (ref 135–145)
Total Bilirubin: 0.3 mg/dL (ref 0.2–1.2)
Total Protein: 7.1 g/dL (ref 6.0–8.3)

## 2014-03-31 LAB — TSH: TSH: 4.145 u[IU]/mL (ref 0.350–4.500)

## 2014-04-01 ENCOUNTER — Other Ambulatory Visit: Payer: Self-pay

## 2014-04-01 DIAGNOSIS — Z1231 Encounter for screening mammogram for malignant neoplasm of breast: Secondary | ICD-10-CM

## 2014-04-01 LAB — URINE CULTURE

## 2014-04-10 ENCOUNTER — Telehealth: Payer: Self-pay

## 2014-04-10 NOTE — Telephone Encounter (Signed)
Labs

## 2014-04-10 NOTE — Telephone Encounter (Signed)
Patient said she received a phone call and is now calling back. Please call patient! [phone]

## 2014-04-11 NOTE — Telephone Encounter (Signed)
See labs. Pt's husband notified.

## 2014-05-05 ENCOUNTER — Ambulatory Visit
Admission: RE | Admit: 2014-05-05 | Discharge: 2014-05-05 | Disposition: A | Discharge status: Home / Self Care | Payer: BLUE CROSS/BLUE SHIELD | Source: Ambulatory Visit

## 2014-05-05 DIAGNOSIS — Z1231 Encounter for screening mammogram for malignant neoplasm of breast: Secondary | ICD-10-CM

## 2014-05-06 ENCOUNTER — Other Ambulatory Visit: Payer: Self-pay | Admitting: Oncology

## 2014-05-06 DIAGNOSIS — R928 Other abnormal and inconclusive findings on diagnostic imaging of breast: Secondary | ICD-10-CM

## 2014-05-08 ENCOUNTER — Ambulatory Visit
Admission: RE | Admit: 2014-05-08 | Discharge: 2014-05-08 | Disposition: A | Discharge status: Home / Self Care | Payer: BLUE CROSS/BLUE SHIELD | Source: Ambulatory Visit | Attending: Oncology | Admitting: Oncology

## 2014-05-08 ENCOUNTER — Other Ambulatory Visit: Payer: Self-pay | Admitting: Oncology

## 2014-05-08 DIAGNOSIS — N6310 Unspecified lump in the right breast, unspecified quadrant: Secondary | ICD-10-CM

## 2014-05-08 DIAGNOSIS — R928 Other abnormal and inconclusive findings on diagnostic imaging of breast: Secondary | ICD-10-CM

## 2014-05-13 ENCOUNTER — Other Ambulatory Visit: Payer: BLUE CROSS/BLUE SHIELD

## 2014-05-15 ENCOUNTER — Other Ambulatory Visit: Payer: Self-pay | Admitting: Oncology

## 2014-05-15 DIAGNOSIS — N6310 Unspecified lump in the right breast, unspecified quadrant: Secondary | ICD-10-CM

## 2014-05-19 ENCOUNTER — Ambulatory Visit
Admission: RE | Admit: 2014-05-19 | Discharge: 2014-05-19 | Disposition: A | Discharge status: Home / Self Care | Payer: BLUE CROSS/BLUE SHIELD | Source: Ambulatory Visit | Attending: Oncology | Admitting: Oncology

## 2014-05-19 ENCOUNTER — Other Ambulatory Visit: Payer: Self-pay | Admitting: Oncology

## 2014-05-19 DIAGNOSIS — N6310 Unspecified lump in the right breast, unspecified quadrant: Secondary | ICD-10-CM

## 2014-05-21 ENCOUNTER — Ambulatory Visit
Admission: RE | Admit: 2014-05-21 | Discharge: 2014-05-21 | Disposition: A | Discharge status: Home / Self Care | Payer: BLUE CROSS/BLUE SHIELD | Source: Ambulatory Visit | Attending: Oncology | Admitting: Oncology

## 2014-05-21 ENCOUNTER — Telehealth: Payer: Self-pay | Admitting: Oncology

## 2014-05-21 DIAGNOSIS — N6310 Unspecified lump in the right breast, unspecified quadrant: Secondary | ICD-10-CM

## 2014-05-21 NOTE — Telephone Encounter (Signed)
Patient confirmed appointment for 05/05.

## 2014-05-26 ENCOUNTER — Other Ambulatory Visit: Payer: Self-pay | Admitting: Oncology

## 2014-05-29 ENCOUNTER — Ambulatory Visit (HOSPITAL_BASED_OUTPATIENT_CLINIC_OR_DEPARTMENT_OTHER): Payer: BLUE CROSS/BLUE SHIELD | Admitting: Oncology

## 2014-05-29 ENCOUNTER — Telehealth: Payer: Self-pay | Admitting: Oncology

## 2014-05-29 VITALS — BP 115/72 | HR 77 | Temp 98.5°F | Resp 18 | Ht 60.5 in | Wt 128.3 lb

## 2014-05-29 DIAGNOSIS — Z853 Personal history of malignant neoplasm of breast: Secondary | ICD-10-CM | POA: Diagnosis not present

## 2014-05-29 DIAGNOSIS — C50919 Malignant neoplasm of unspecified site of unspecified female breast: Secondary | ICD-10-CM

## 2014-05-29 MED ORDER — SUMATRIPTAN SUCCINATE 50 MG PO TABS: 50.0000 mg | ORAL_TABLET | ORAL | Status: DC | PRN

## 2014-05-29 NOTE — Telephone Encounter (Signed)
Gave avs & calendar for April/May 2017

## 2014-05-29 NOTE — Progress Notes (Signed)
ID: Murlean Caller   DOB: [birth year]  MR#: [id number]  CSN#:[id number]    CHIEF COMPLAINT: estrogen receptor positive breast cancer  CURRENT TREATMENT: observation  HISTORY OF PRESENT ILLNESS:  from the original intake note:  The patient herself palpated a mass in her left breast.  I believe she brought this to Dr. Herbie Baltimore Doolittle's attention and he set her up for mammography on 04/04/2006.  This indeed confirmed the presence of a possible mass in the left breast and diagnostic mammography on 04/11/2006 confirmed the presence of an ill-defined density in the left breast, which by ultrasound was irregular and hypoechoic, measuring up to 1.8 cm.  The mass was biopsied the same day and this showed ([id number] and [id number]) an invasive ductal carcinoma, which was 99% positive for the estrogen receptor, 100% positive for the progesterone receptor, with a low proliferation marker at 13%, negative for HER-2 at 1+.    With this information, the patient was referred to Dr. Rosana Hoes and on 04/19/2006, bilateral breast MRI were obtained.  This showed only the solitary area of enhancement in the left breast, which measured 2.0 cm by MRI.  There was a 2.8-cm non-enhancing mass in the right hepatic lobe, felt most likely to be a cyst.  With this information, after appropriate discussion, the patient proceeded to definitive left lumpectomy and sentinel lymph node biopsy under Lennie Hummer, 05/01/2006.  The final pathology ([id number]) showed a 2.0-cm infiltrating ductal carcinoma, grade 2, with negative margins and no evidence of lymphovascular invasion.  The sentinel lymph node was negative.   Her subsequent history is as detailed below  INTERVAL HISTORY:  The patient returns today for followup of her left breast cancer accompanied by a translator. Since her last visit here she had screening mammography 05/05/2014, which showed a possible asymmetry in the right breast.  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   REVIEW OF SYSTEMS:  Carmin  Has headaches particularly affecting the right temporal area. They occur 2-3 times a month. They can lasted day or so. They are associated with nausea and vomiting, and a sensation of pressure in the right eye, but not with obvious or. She feels moderately tired. She of course works full time at [employer]. She thinks her gums need help. When she urinates she sees a lot of bubbles in the water. She has some back and joint pain. This is not more persistent or intense than before. Otherwise a detailed review of systems today was stable   PAST MEDICAL HISTORY: Past Medical History  Diagnosis Date  . Cancer     Left Breast cancer; lumpectomy with radiation; chemotherapy.  . Migraines   . Frequent headaches   . Diabetes   . Blood in stool     PAST SURGICAL HISTORY: S/p cesarean section  FAMILY HISTORY The patient's parents are alive The patient has 2 brothers.  There is no history of breast or ovarian cancer in the family.  GYNECOLOGIC HISTORY: She is GX, P3, first pregnancy age 45, she is still menstruating regularly, although periods are now scant   SOCIAL HISTORY: She and her husband, Judith Part, run the [employer]  on [address], close to [employer].  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  They have 3 daughters, the oldest currently 18 at [place].  The patient's brother is also in [place].     ADVANCED DIRECTIVES: not in place  HEALTH MAINTENANCE: History  Substance Use Topics  . Smoking status: Never Smoker   . Smokeless tobacco: Never Used  . Alcohol Use: No     Colonoscopy:  PAP:   Bone density:  Lipid panel:  No Known Allergies  Current Outpatient Prescriptions  Medication Sig Dispense Refill  . Cholecalciferol (VITAMIN D PO) Take by mouth  daily.    . hydrocortisone (ANUSOL-HC) 25 MG suppository Place 1 suppository (25 mg total) rectally 2 (two) times daily. 12 suppository 0  . PARAGARD INTRAUTERINE COPPER IU by Intrauterine route.    . SUMAtriptan (IMITREX) 50 MG tablet Take 1 tablet (50 mg total) by mouth every 2 (two) hours as needed for migraine. May repeat in 2 hours if headache persists or recurs. 10 tablet 0  . tretinoin (RETIN-A) 0.1 % cream Apply topically at bedtime. 45 g 0   No current facility-administered medications for this visit.    OBJECTIVE: Early middle age  [place] Asian woman  In no acute distress  Filed Vitals:   05/29/14 1449  BP: 115/72  Pulse: 77  Temp: 98.5 F (36.9 C)  Resp: 18     Body mass index is 24.63 kg/(m^2).    ECOG FS: 1 Filed Weights   05/29/14 1449  Weight: 128 lb 4.8 oz (58.196 kg)   Sclerae unicteric, pupils Round and equal, EOMs intact, no right temporal or orbital mass or other abnormality noted by inspection and palpation Oropharynx clear and moist No cervical or supraclavicular adenopathy Lungs no rales or rhonchi Heart regular rate and rhythm Abd soft, nontender, positive bowel sounds MSK no focal spinal tenderness, no upper extremity lymphedema Neuro: nonfocal, well oriented, positive affect Breasts: the right breast is unremarkable. The left breast is status post lumpectomy and radiation. There is no evidence of local recurrence. All along the lateral inferior edge of the left breast there is a 3 cm soft mass consistent with a lipoma. The left axilla is benign  LAB RESULTS: Lab Results  Component Value Date   WBC 6.6 03/30/2014   NEUTROABS 2.9 05/02/2012   HGB 13.0 03/30/2014   HCT 41.0 03/30/2014   MCV 93.7 03/30/2014   PLT 263 05/02/2012      Chemistry      Component Value Date/Time   NA 136 03/30/2014 1559   NA 140 05/02/2012 0904   K 3.8 03/30/2014 1559   K 4.0 05/02/2012 0904   CL 105 03/30/2014 1559   CL 106 05/02/2012 0904   CO2 23 03/30/2014 1559    CO2 25 05/02/2012 0904   BUN 12 03/30/2014 1559   BUN 9.8 05/02/2012 0904   CREATININE 0.74 03/30/2014 1559   CREATININE 0.8 05/02/2012 0904   CREATININE 0.68 03/07/2011 0901      Component Value Date/Time   CALCIUM 9.1 03/30/2014 1559   CALCIUM 8.7 05/02/2012 0904   ALKPHOS 61 03/30/2014 1559   ALKPHOS 67 05/02/2012 0904   AST 19 03/30/2014 1559   AST 25 05/02/2012 0904   ALT 23 03/30/2014 1559   ALT 24 05/02/2012 0904   BILITOT 0.3 03/30/2014 1559   BILITOT 0.57 05/02/2012 0904       STUDIES:  Mm Digital Diagnostic Unilat R  05/19/2014   CLINICAL DATA:  STATUS POST ULTRASOUND-GUIDED CORE BIOPSY OF MASS in the 12:30 o'clock location of the right breast.  EXAM: DIAGNOSTIC RIGHT MAMMOGRAM POST ULTRASOUND BIOPSY  COMPARISON:  Previous exam(s).  FINDINGS: Mammographic images were obtained following ultrasound guided biopsy of mass in the 12:30 o'clock location of the right breast. A ribbon shaped clip is identified in the upper portion of the right breast as expected. The tissue marker clip is not in the same area which was questioned on the original screening study. However, the mammographic abnormality resolved on subsequent tomographic evaluation.  IMPRESSION: Tissue marker clip is in expected location following biopsy.  Final Assessment: Post Procedure Mammograms for Marker Placement   Electronically Signed   By: Nolon Nations M.D.   On: 05/19/2014 15:19   Mm Digital Screening Bilateral  05/05/2014   CLINICAL DATA:  Screening.  EXAM: DIGITAL SCREENING BILATERAL MAMMOGRAM WITH CAD  COMPARISON:  Previous exam(s).  ACR Breast Density Category c: The breast tissue is heterogeneously dense, which may obscure small masses.  FINDINGS: In the right breast, a possible asymmetry warrants further evaluation. In the left breast, no findings suspicious for malignancy. Images were processed with CAD.  IMPRESSION: Further evaluation is suggested for possible asymmetry in the right breast.   RECOMMENDATION: Diagnostic mammogram and possibly ultrasound of the right breast. (Code:[id number]-R-42M)  The patient will be contacted regarding the findings, and additional imaging will be scheduled.  BI-RADS CATEGORY  0: Incomplete. Need additional imaging evaluation and/or prior mammograms for comparison.   Electronically Signed   By: Abelardo Diesel M.D.   On: 05/05/2014 15:30   Mm Radiologist Eval And Mgmt  05/21/2014   EXAM: ESTABLISHED PATIENT OFFICE VISIT - LEVEL II  CHIEF COMPLAINT: The patient returns for results following right breast ultrasound-guided core biopsy.  Current Pain Level: 2  HISTORY OF PRESENT ILLNESS: Patient was recalled from screening mammogram. Diagnostic evaluation demonstrated pain circumscribed 6 mm mass within the right breast at 12:30 o'clock position. Following conversation with the patient, ultrasound-guided core biopsy was scheduled and then performed on 05/19/2014. There is no family history of breast cancer. The patient is accompanied by a Optometrist.  EXAM: The biopsy site is clean and dry without hematoma formation. There is mild tenderness and mild ecchymosis.  PATHOLOGY: The pathology demonstrated changes consistent with a benign fibroadenoma. There was no atypia or malignancy. This is concordant with the imaging findings.  ASSESSMENT AND PLAN: ASSESSMENT AND PLAN I have discussed findings with the patient via an interpreter and answered her questions. Post biopsy wound care instructions were reviewed with the patient. I recommend bilateral screening mammography in 1 year.   Electronically Signed   By: Altamese Cabal M.D.   On: 05/21/2014 13:40   US Breast Ltd Uni Right Inc Axilla  05/08/2014   CLINICAL DATA:  44 year old female, callback from screening mammogram for possible right breast asymmetry. The patient reports that she is still concerned about a lump in her lower, outer left breast for which she was previously evaluated in 2015.  EXAM: DIGITAL DIAGNOSTIC RIGHT  MAMMOGRAM WITH 3D TOMOSYNTHESIS WITH CAD  ULTRASOUND RIGHT BREAST  COMPARISON:  05/05/2014, additional prior studies dating back to 04/07/2008  ACR Breast Density Category c: The breast tissue is heterogeneously dense, which may obscure small masses.  FINDINGS: CC and MLO tomosynthesis views of the right breast were performed. On the additional views, the possible asymmetry seen on screening mammogram persists but is thought to likely represent normal parenchymal tissue. No suspicious mass or calcifications are identified.  Mammographic images were processed with CAD.  On physical exam, no discrete mass is felt in the area of concern within the periareolar region.  Targeted ultrasound is performed, showing a circumscribed, oval, hypoechoic mass at 12:30, 1 cm from the nipple measuring 6 x 5 x 4 mm. This finding may be incidental. No suspicious cystic or solid sonographic finding was identified in the area of concern.  IMPRESSION: Probably benign right breast finding.  RECOMMENDATION: 1. Options including short-term follow-up versus ultrasound-guided biopsy were discussed with the patient. The patient wishes to pursue biopsy and will be scheduled at her convenience. 2. The patient was instructed to follow-up with her referring physician regarding her area of persistent concern in her lower, outer left breast.  I have discussed the findings and recommendations with the patient through an interpreter. Results were also provided in writing at the conclusion of the visit. If applicable, a reminder letter will be sent to the patient regarding the next appointment.  BI-RADS CATEGORY  3: Probably benign.   Electronically Signed   By: Pamelia Hoit M.D.   On: 05/08/2014 15:06   Mm Diag Breast Tomo Uni Right  05/08/2014   CLINICAL DATA:  44 year old female, callback from screening mammogram for possible right breast asymmetry. The patient reports that she is still concerned about a lump in her lower, outer left breast for  which she was previously evaluated in 2015.  EXAM: DIGITAL DIAGNOSTIC RIGHT MAMMOGRAM WITH 3D TOMOSYNTHESIS WITH CAD  ULTRASOUND RIGHT BREAST  COMPARISON:  05/05/2014, additional prior studies dating back to 04/07/2008  ACR Breast Density Category c: The breast tissue is heterogeneously dense, which may obscure small masses.  FINDINGS: CC and MLO tomosynthesis views of the right breast were performed. On the additional views, the possible asymmetry seen on screening mammogram persists but is thought to likely represent normal parenchymal tissue. No suspicious mass or calcifications are identified.  Mammographic images were processed with CAD.  On physical exam, no discrete mass is felt in the area of concern within the periareolar region.  Targeted ultrasound is performed, showing a circumscribed, oval, hypoechoic mass at 12:30, 1 cm from the nipple measuring 6 x 5 x 4 mm. This finding may be incidental. No suspicious cystic or solid sonographic finding was identified in the area of concern.  IMPRESSION: Probably benign right breast finding.  RECOMMENDATION: 1. Options including short-term follow-up versus ultrasound-guided biopsy were discussed with the patient. The patient wishes to pursue biopsy and will be scheduled at her convenience. 2. The patient was instructed to follow-up with her referring physician regarding her area of persistent concern in her lower, outer left breast.  I have discussed the findings and recommendations with the patient through an interpreter. Results were also provided in writing at the conclusion of the visit. If applicable, a reminder letter will be sent to the patient regarding the next appointment.  BI-RADS CATEGORY  3: Probably benign.   Electronically Signed   By: Pamelia Hoit M.D.   On: 05/08/2014 15:06   [place] Rt Breast Bx W Loc Dev 1st Lesion Img Bx Spec US Guide  05/19/2014   CLINICAL DATA:  Patient presents for ultrasound-guided core biopsy of the right breast. History of left  breast cancer treated with radiation therapy and chemotherapy in 2008.  EXAM: ULTRASOUND GUIDED RIGHT BREAST CORE NEEDLE BIOPSY  COMPARISON:  Previous exam(s).  PROCEDURE: I met with the patient and we discussed the procedure of ultrasound-guided biopsy, including benefits and alternatives. We discussed the high likelihood of a successful procedure. We discussed the risks of the procedure including infection, bleeding, tissue injury, clip migration, and inadequate sampling. Informed written  consent was given. The usual time-out protocol was performed immediately prior to the procedure.  Using sterile technique and 2% Lidocaine as local anesthetic, under direct ultrasound visualization, a 12 gauge vacuum-assisted device was used to perform biopsy of mass in the 12:30 o'clock location of the right breastusing a medial approach. At the conclusion of the procedure, a ribbon shaped tissue marker clip was deployed into the biopsy cavity. Follow-up 2-view mammogram was performed and dictated separately.  IMPRESSION: Ultrasound-guided biopsy of right breast mass. No apparent complications.   Electronically Signed   By: Nolon Nations M.D.   On: 05/19/2014 14:43    ASSESSMENT: A 44 y.o.  Mandarin speaker currently residing in [place],  (1)   status post left lumpectomy and sentinel lymph node biopsy in April 2008 for a pT1c pN0, stage IA, invasive ductal carcinoma, grade 2, strongly  Estrogen and progesterone receptor positive, HER-2/neu negative, with low MIB-1.    (2)  Treated adjuvantly with cyclophosphamide and docetaxel x4 followed by radiation therapy,  Completed October 2008.    (3)  On tamoxifen between October 2008 and October 2013.    PLAN: Vallorie  Is 8 years out from her original surgery with no evidence of disease recurrence this is very favorable. Of course it is always worrisome when one is called back from a study and requires a biopsy but I reassured her the results were benign.  She remains  very anxious about the mass in the left chest wall area immediately inferior laterally to her breast. This was imaged in 2015 , and found to be fatty consistency. She will feel much better if we reimage it in a year and that is what we will do.  Accordingly I have set her up for bilateral diagnostic mammography in a year as well as left breast ultrasonography.   Quite aside from that, I do believe she is experiencing migraines. We discussed that at some length today through the interpreter and I wrote her a prescription for sumatriptan. She understands she is to take this at the very start of a headache and it should help it resolve within an hour. If it does not work she will call us back.  Otherwise she will see me again in one year, after her next mammogram. At that point we will refer her to our survivorship advanced practice provider.   MAGRINAT,GUSTAV C    05/29/2014

## 2014-06-13 ENCOUNTER — Ambulatory Visit (INDEPENDENT_AMBULATORY_CARE_PROVIDER_SITE_OTHER): Payer: BLUE CROSS/BLUE SHIELD | Admitting: Physician Assistant

## 2014-06-13 VITALS — BP 105/72 | HR 101 | Temp 98.4°F | Resp 18 | Wt 126.0 lb

## 2014-06-13 DIAGNOSIS — N949 Unspecified condition associated with female genital organs and menstrual cycle: Secondary | ICD-10-CM

## 2014-06-13 DIAGNOSIS — B373 Candidiasis of vulva and vagina: Secondary | ICD-10-CM

## 2014-06-13 DIAGNOSIS — Z975 Presence of (intrauterine) contraceptive device: Secondary | ICD-10-CM | POA: Diagnosis not present

## 2014-06-13 DIAGNOSIS — Z01419 Encounter for gynecological examination (general) (routine) without abnormal findings: Secondary | ICD-10-CM

## 2014-06-13 DIAGNOSIS — Z8669 Personal history of other diseases of the nervous system and sense organs: Secondary | ICD-10-CM | POA: Diagnosis not present

## 2014-06-13 DIAGNOSIS — Z538 Procedure and treatment not carried out for other reasons: Secondary | ICD-10-CM

## 2014-06-13 DIAGNOSIS — B3731 Acute candidiasis of vulva and vagina: Secondary | ICD-10-CM

## 2014-06-13 LAB — POCT WET PREP WITH KOH
KOH PREP POC: POSITIVE
Trichomonas, UA: NEGATIVE
YEAST WET PREP PER HPF POC: NEGATIVE

## 2014-06-13 MED ORDER — FLUCONAZOLE 150 MG PO TABS: 150.0000 mg | ORAL_TABLET | Freq: Once | ORAL | Status: DC

## 2014-06-13 NOTE — Progress Notes (Signed)
Subjective:    Patient ID: Sierra Mendoza, female    DOB: 1970/09/09, 44 y.o.   MRN: [id number]  HPI Pt presents to clinic for an IUD removal.  She states it has been in placed since 2007 and she wants to out.  She knows it is a little early but she is having cramping an increase in acne and she thinks that it is the IUD.  She states her IUD was placed in [place] and she believes it is round in shape.  She is married and only has sex with her husband.  She currently has no other forms of preventing pregnancy.  She is also have vaginal itching and thinks that might be from the IUD also.  She thinks she might also need a pap smear.  She had it last year and it was normal.  Review of Systems Patient Active Problem List   Diagnosis Date Noted  . Hx of migraine headaches 06/13/2014  . Breast cancer, left breast 03/16/2011   Prior to Admission medications   Medication Sig Start Date End Date Taking? Authorizing Provider  Cholecalciferol (VITAMIN D PO) Take by mouth daily.   Yes Historical Provider, MD  hydrocortisone (ANUSOL-HC) 25 MG suppository Place 1 suppository (25 mg total) rectally 2 (two) times daily. 03/30/14  Yes Chelle Jeffery, PA-C  PARAGARD INTRAUTERINE COPPER IUD IUD by Intrauterine route. 06/13/14  Yes Mancel Bale, PA-C  SUMAtriptan (IMITREX) 50 MG tablet Take 1 tablet (50 mg total) by mouth every 2 (two) hours as needed for migraine. May repeat in 2 hours if headache persists or recurs. 05/29/14  Yes Chauncey Cruel, MD  tretinoin (RETIN-A) 0.1 % cream Apply topically at bedtime. 03/30/14  Yes Chelle Jeffery, PA-C   No Known Allergies  Medications, allergies, past medical history, surgical history, family history, social history and problem list reviewed and updated.      Objective:   Physical Exam  Constitutional: She is oriented to person, place, and time. She appears well-developed and well-nourished.  BP 105/72 mmHg  Pulse 101  Temp(Src) 98.4 F (36.9 C) (Oral)  Resp 18  Wt 126  lb (57.153 kg)  SpO2 98%  LMP 05/14/2014   HENT:  Head: Normocephalic and atraumatic.  Right Ear: External ear normal.  Left Ear: External ear normal.  Eyes: Conjunctivae are normal.  Cardiovascular: Normal rate, regular rhythm and normal heart sounds.   No murmur heard. Pulmonary/Chest: Effort normal and breath sounds normal. She has no wheezes.  Abdominal: Soft. Bowel sounds are normal.  Genitourinary: Uterus normal. Pelvic exam was performed with patient supine. There is no rash, tenderness, lesion or injury on the right labia. There is no rash, tenderness, lesion or injury on the left labia. Cervix exhibits no motion tenderness, no discharge and no friability. Right adnexum displays no mass, no tenderness and no fullness. Left adnexum displays no mass, no tenderness and no fullness. Vaginal discharge (thick white d/c) found.  No IUD string seen so unable to remove the IUD.  Neurological: She is alert and oriented to person, place, and time.  Skin: Skin is warm and dry.  Psychiatric: She has a normal mood and affect. Her behavior is normal. Judgment and thought content normal.    Results for orders placed or performed in visit on 06/13/14  POCT Wet Prep with KOH  Result Value Ref Range   Trichomonas, UA Negative    Clue Cells Wet Prep HPF POC 3-5    Epithelial Wet Prep HPF  POC 5-10    Yeast Wet Prep HPF POC neg    Bacteria Wet Prep HPF POC large    RBC Wet Prep HPF POC 2-4    WBC Wet Prep HPF POC 15-25    KOH Prep POC Positive        Assessment & Plan:  Attempted IUD removal, unsuccessful - Unable to remove because I could not find the string - we will do a referral to GYN so an [place] can be used if needed.  She does not have an interest in another form of birth control at this time and plans to use condoms for pregnancy protection.  Plan: Ambulatory referral to Gynecology  Hx of migraine headaches  - she has medication for when these occur  Vaginal discomfort - Plan: POCT Wet  Prep with KOH  Encounter for routine gynecological examination - Plan: pt is updated for her pap screening she had a normal pap smear in 2015 with neg HPV testing  Yeast vaginitis - Plan: fluconazole (DIFLUCAN) 150 MG tablet  Windell Hummingbird PA-C  [facility] 06/13/2014 3:46 PM

## 2014-06-13 NOTE — Patient Instructions (Signed)
[place] [address] [place], [place] [address] Phone number: [phone]

## 2014-06-27 ENCOUNTER — Other Ambulatory Visit: Payer: Self-pay | Admitting: Oncology

## 2014-07-16 ENCOUNTER — Ambulatory Visit: Payer: BLUE CROSS/BLUE SHIELD | Admitting: Gynecology

## 2014-08-13 ENCOUNTER — Other Ambulatory Visit: Payer: Self-pay | Admitting: Oncology

## 2014-08-21 ENCOUNTER — Ambulatory Visit (INDEPENDENT_AMBULATORY_CARE_PROVIDER_SITE_OTHER): Payer: BLUE CROSS/BLUE SHIELD | Admitting: Gynecology

## 2014-08-21 ENCOUNTER — Other Ambulatory Visit: Payer: Self-pay | Admitting: Gynecology

## 2014-08-21 ENCOUNTER — Encounter: Payer: Self-pay | Admitting: Gynecology

## 2014-08-21 ENCOUNTER — Telehealth: Payer: Self-pay

## 2014-08-21 ENCOUNTER — Ambulatory Visit (INDEPENDENT_AMBULATORY_CARE_PROVIDER_SITE_OTHER): Payer: BLUE CROSS/BLUE SHIELD

## 2014-08-21 VITALS — BP 120/76 | Ht 61.0 in | Wt 125.0 lb

## 2014-08-21 DIAGNOSIS — N831 Corpus luteum cyst of ovary, unspecified side: Secondary | ICD-10-CM

## 2014-08-21 DIAGNOSIS — Z30432 Encounter for removal of intrauterine contraceptive device: Secondary | ICD-10-CM | POA: Diagnosis not present

## 2014-08-21 DIAGNOSIS — R102 Pelvic and perineal pain: Secondary | ICD-10-CM

## 2014-08-21 DIAGNOSIS — N920 Excessive and frequent menstruation with regular cycle: Secondary | ICD-10-CM

## 2014-08-21 DIAGNOSIS — N852 Hypertrophy of uterus: Secondary | ICD-10-CM | POA: Diagnosis not present

## 2014-08-21 DIAGNOSIS — T8389XA Other specified complication of genitourinary prosthetic devices, implants and grafts, initial encounter: Secondary | ICD-10-CM | POA: Diagnosis not present

## 2014-08-21 MED ORDER — DOXYCYCLINE HYCLATE 100 MG PO CAPS: 100.0000 mg | ORAL_CAPSULE | Freq: Two times a day (BID) | ORAL | Status: DC

## 2014-08-21 MED ORDER — LIDOCAINE HCL 1 % IJ SOLN
8.0000 mL | Freq: Once | INTRAMUSCULAR | Status: AC
  Administered 2014-08-21: 8 mL

## 2014-08-21 NOTE — Progress Notes (Signed)
   Patient is a 44 year old gravida 3 para 3 with past history of breast cancer was referred to our practice by her medical provider Windell Hummingbird PAC-C as a result of the inability removed IUD that patient had in for 9 years. Patient stated that this [place] approximate 9 [place]. She reports that the first 2 days of her cycle are heavy and then light for another 6 days. Patient's Pap smear are up-to-date as well as mammogram.  Review of her record indicated in April 2008 patient had a left lumpectomy and sentinel lymph node biopsy for a pT1c pN0, stage IA, invasive ductal carcinoma, grade 2, strongly Estrogen and progesterone receptor positive, HER-2/neu negative, with low MIB-1.Patient subsequently was treated with adjuvant therapy consisting of which she completed in 2008. Patient then was on tamoxifen from October 2008 until October 2013. She is currently being followed by her medical oncologist Dr. Gunnar Bulla Magrinat  Exam: Blood pressure 120/76  weight 125 pounds  5 feet 1 inches tall  BMI 23.62 kg/m Pelvic: Bartholin urethra Skene was within normal limits Vagina: No lesions or discharge Cervix: No lesions or discharge IUD string not visualized Uterus: Anteverted normal size shape and consistency Adnexa: No palpable masses or tenderness Rectal vaginal exam not done  The cervix was cleansed with Betadine solution and a small work was introduced into the uterine cavity the string was not able to be retrieved to the level that it would be pulled out so the patient was taken to the ultrasound room and attempt to remove it under sonographic guidance.  A paracervical block 1% lidocaine was infiltrated at the 2, 4, 8, and 10:00 position for totally cc. Several attempts with an intrauterine [place] as well as with a [place] clamp was unable to retrieve the IUD it appears to be partially embedded in the myometrium.  A report as follows: Uterus measured 14.2 x 6.9 x 5.2 cm with  endometrial stripe of 13.2 mm retroflex uterus enlargement IUD was seen in the uterus triangular-shaped appears 1.7 mm wall of the left myometrium. Right ovary was normal. Left ovary corpus luteum cyst measuring 2.3 x 2.4 x 2.5 cm with color flow present the periphery anterior wall. Internal low level echo. No fluid in the cul-de-sac.  Assessment/plan: Patient with a nonhormonal IUD placed approximately 9 years ago in [place]. Patient states that it was a [place] doctor who had the IUD that he had obtained from [place]. After several attempts to remove the IUD it was decided that it would be safer to remove in an outpatient setting hysteroscopically. For prophylaxis she is going to be called in a prescription for Vibramycin 100 mg to take 1 by mouth twice a day for the next 7 days. Patient with past history of breast cancer as described above. Patient will return for preop exam after her surgery is been scheduled.

## 2014-08-21 NOTE — Telephone Encounter (Signed)
She is scheduled for Resectoscopic Retrieval of IUD on 09/02/14 but called stating she thinks her period may be starting or be on around that time. She asked if she should reschedule?

## 2014-08-21 NOTE — Telephone Encounter (Signed)
It may be better when she is not menstruating so we may have to schedule at a later date

## 2014-08-21 NOTE — Telephone Encounter (Signed)
Patient informed.  Surgery rescheduled to 09/16/14 7:30am.

## 2014-08-21 NOTE — Telephone Encounter (Signed)
I spoke with patient and informed her of this. Rx had already been sent by Dr. Moshe Salisbury.

## 2014-08-21 NOTE — Telephone Encounter (Signed)
Dr. Moshe Salisbury sent me the following staff message:  "Juliann Pulse please call patient and tell her that I would like for her to take Vibramycin 100 mg twice a day for 7 days to prevent an infection since we had several attempts to remove her IUD and were unsuccessful. Please inform her that because of her history of breast cancer she is not a candidate for the Mirena IUD."

## 2014-08-22 ENCOUNTER — Encounter: Payer: Self-pay | Admitting: Gynecology

## 2014-08-27 ENCOUNTER — Encounter (HOSPITAL_COMMUNITY): Payer: Self-pay | Admitting: *Deleted

## 2014-09-01 ENCOUNTER — Other Ambulatory Visit: Payer: Self-pay | Admitting: Physician Assistant

## 2014-09-01 ENCOUNTER — Ambulatory Visit: Payer: BLUE CROSS/BLUE SHIELD | Admitting: Gynecology

## 2014-09-11 ENCOUNTER — Ambulatory Visit (INDEPENDENT_AMBULATORY_CARE_PROVIDER_SITE_OTHER): Payer: BLUE CROSS/BLUE SHIELD | Admitting: Gynecology

## 2014-09-11 ENCOUNTER — Encounter: Payer: Self-pay | Admitting: Gynecology

## 2014-09-11 VITALS — BP 110/80 | Ht 61.0 in | Wt 125.0 lb

## 2014-09-11 DIAGNOSIS — B3731 Acute candidiasis of vulva and vagina: Secondary | ICD-10-CM

## 2014-09-11 DIAGNOSIS — N760 Acute vaginitis: Secondary | ICD-10-CM | POA: Diagnosis not present

## 2014-09-11 DIAGNOSIS — N898 Other specified noninflammatory disorders of vagina: Secondary | ICD-10-CM

## 2014-09-11 DIAGNOSIS — B9689 Other specified bacterial agents as the cause of diseases classified elsewhere: Secondary | ICD-10-CM

## 2014-09-11 DIAGNOSIS — Z01818 Encounter for other preprocedural examination: Secondary | ICD-10-CM | POA: Diagnosis not present

## 2014-09-11 DIAGNOSIS — A499 Bacterial infection, unspecified: Secondary | ICD-10-CM

## 2014-09-11 DIAGNOSIS — B373 Candidiasis of vulva and vagina: Secondary | ICD-10-CM

## 2014-09-11 LAB — WET PREP FOR TRICH, YEAST, CLUE: TRICH WET PREP: NONE SEEN

## 2014-09-11 MED ORDER — TINIDAZOLE 500 MG PO TABS: ORAL_TABLET | ORAL | Status: DC

## 2014-09-11 MED ORDER — FLUCONAZOLE 150 MG PO TABS: 150.0000 mg | ORAL_TABLET | Freq: Once | ORAL | Status: DC

## 2014-09-11 NOTE — Patient Instructions (Signed)
Fluconazole injection What is this medicine? FLUCONAZOLE (floo KON na zole) is an antifungal medicine. It is used to treat or prevent certain kinds of fungal or yeast infections. This medicine may be used for other purposes; ask your health care provider or pharmacist if you have questions. COMMON BRAND NAME(S): Diflucan What should I tell my health care provider before I take this medicine? They need to know if you have any of these conditions: -history of irregular heart beat -kidney disease -an unusual or allergic reaction to fluconazole, other antifungal medicines, foods, dyes or preservatives -pregnant or trying to get pregnant -breast-feeding How should I use this medicine? This medicine is for injection into a vein. It is usually given by a health care professional in a hospital or clinic setting. If you get this medicine at home, you will be taught how to prepare and give this medicine. Use exactly as directed. Take your medicine at regular intervals. Do not take your medicine more often than directed. It is important that you put your used needles and syringes in a special sharps container. Do not put them in a trash can. If you do not have a sharps container, call your pharmacist or healthcare provider to get one. Talk to your pediatrician regarding the use of this medicine in children. Special care may be needed. Overdosage: If you think you have taken too much of this medicine contact a poison control center or emergency room at once. NOTE: This medicine is only for you. Do not share this medicine with others. What if I miss a dose? This does not apply. What may interact with this medicine? Do not take this medicine with any of the following medications: -cisapride -pimozide -red yeast rice This medicine may also interact with the following medications: -birth control pills -cyclosporine -diuretics like hydrochlorothiazide -medicines for diabetes that are taken by  mouth -medicines for high cholesterol like atorvastatin, lovastatin or simvastatin -phenytoin -ramelteon -rifabutin -rifampin -some medicines for anxiety or sleep -tacrolimus -terfenadine -theophylline -tofacitinib -warfarin This list may not describe all possible interactions. Give your health care provider a list of all the medicines, herbs, non-prescription drugs, or dietary supplements you use. Also tell them if you smoke, drink alcohol, or use illegal drugs. Some items may interact with your medicine. What should I watch for while using this medicine? Tell your doctor if your symptoms do not improve. If you are taking this medicine for a long time you may need blood work. Some fungal infections need many weeks or months of treatment to cure completely. Alcohol can increase possible damage to your liver from this medicine. Avoid alcoholic drinks. What side effects may I notice from receiving this medicine? Side effects that you should report to your doctor or health care professional as soon as possible: -allergic reactions like skin rash or itching, hives, swelling of the lips, mouth, tongue, or throat -dark urine -feeling dizzy or faint -irregular heartbeat or chest pain -pain, redness at site of injection -redness, blistering, peeling or loosening of the skin, including inside the mouth -stomach pain -trouble breathing -unusual bruising or bleeding -vomiting -yellowing of the eyes or skin Side effects that usually do not require medical attention (report to your doctor or health care professional if they continue or are bothersome): -changes in how food tastes -diarrhea -headache -stomach upset, nausea This list may not describe all possible side effects. Call your doctor for medical advice about side effects. You may report side effects to FDA at 1-800-FDA-1088. Where should  I keep my medicine? Keep out of the reach of children. If you are using this medicine at home, you  will be instructed on how to store this medicine. Throw away any unused medicine after the expiration date on the label. NOTE: This sheet is a summary. It may not cover all possible information. If you have questions about this medicine, talk to your doctor, pharmacist, or health care provider.  2015, Elsevier/Gold Standard. (2012-08-18 15:51:41) Tinidazole tablets What is this medicine? TINIDAZOLE (tye NI da zole) is an antiinfective. It is used to treat amebiasis, giardiasis, trichomoniasis, and vaginosis. It will not work for colds, flu, or other viral infections. This medicine may be used for other purposes; ask your health care provider or pharmacist if you have questions. COMMON BRAND NAME(S): Tindamax What should I tell my health care provider before I take this medicine? They need to know if you have any of these conditions: -anemia or other blood disorders -if you frequently drink alcohol containing drinks -receiving hemodialysis -seizure disorder -an unusual or allergic reaction to tinidazole, other medicines, foods, dyes, or preservatives -pregnant or trying to get pregnant -breast-feeding How should I use this medicine? Take this medicine by mouth with a full glass of water. Follow the directions on the prescription label. Take with food. Take your medicine at regular intervals. Do not take your medicine more often than directed. Take all of your medicine as directed even if you think you are better. Do not skip doses or stop your medicine early. Talk to your pediatrician regarding the use of this medicine in children. While this drug may be prescribed for children as young as 45 years of age for selected conditions, precautions do apply. Overdosage: If you think you have taken too much of this medicine contact a poison control center or emergency room at once. NOTE: This medicine is only for you. Do not share this medicine with others. What if I miss a dose? If you miss a dose,  take it as soon as you can. If it is almost time for your next dose, take only that dose. Do not take double or extra doses. What may interact with this medicine? Do not take this medicine with any of the following medications: -alcohol or any product that contains alcohol -amprenavir oral solution -disulfiram -paclitaxel injection -ritonavir oral solution -sertraline oral solution -sulfamethoxazole-trimethoprim injection This medicine may also interact with the following medications: -cholestyramine -cimetidine -conivaptan -cyclosporin -fluorouracil -fosphenytoin, phenytoin -ketoconazole -lithium -phenobarbital -tacrolimus -warfarin This list may not describe all possible interactions. Give your health care provider a list of all the medicines, herbs, non-prescription drugs, or dietary supplements you use. Also tell them if you smoke, drink alcohol, or use illegal drugs. Some items may interact with your medicine. What should I watch for while using this medicine? Tell your doctor or health care professional if your symptoms do not improve or if they get worse. Avoid alcoholic drinks while you are taking this medicine and for three days afterward. Alcohol may make you feel dizzy, sick, or flushed. If you are being treated for a sexually transmitted disease, avoid sexual contact until you have finished your treatment. Your sexual partner may also need treatment. What side effects may I notice from receiving this medicine? Side effects that you should report to your doctor or health care professional as soon as possible: -allergic reactions like skin rash, itching or hives, swelling of the face, lips, or tongue -breathing problems -confusion, depression -dark or white patches in  the mouth -feeling faint or lightheaded, falls -fever, infection -numbness, tingling, pain or weakness in the hands or feet -pain when passing urine -seizures -unusually weak or tired -vaginal irritation  or discharge -vomiting Side effects that usually do not require medical attention (report to your doctor or health care professional if they continue or are bothersome): -dark brown or reddish urine -diarrhea -headache -loss of appetite -metallic taste -nausea -stomach upset This list may not describe all possible side effects. Call your doctor for medical advice about side effects. You may report side effects to FDA at 1-800-FDA-1088. Where should I keep my medicine? Keep out of the reach of children. Store at room temperature between 15 and 30 degrees C (59 and 86 degrees F). Protect from light and moisture. Keep container tightly closed. Throw away any unused medicine after the expiration date. NOTE: This sheet is a summary. It may not cover all possible information. If you have questions about this medicine, talk to your doctor, pharmacist, or health care provider.  2015, Elsevier/Gold Standard. (2007-10-08 15:22:28) Bacterial Vaginosis Bacterial vaginosis is a vaginal infection that occurs when the normal balance of bacteria in the vagina is disrupted. It results from an overgrowth of certain bacteria. This is the most common vaginal infection in women of childbearing age. Treatment is important to prevent complications, especially in pregnant women, as it can cause a premature delivery. CAUSES  Bacterial vaginosis is caused by an increase in harmful bacteria that are normally present in smaller amounts in the vagina. Several different kinds of bacteria can cause bacterial vaginosis. However, the reason that the condition develops is not fully understood. RISK FACTORS Certain activities or behaviors can put you at an increased risk of developing bacterial vaginosis, including:  Having a new sex partner or multiple sex partners.  Douching.  Using an intrauterine device (IUD) for contraception. Women do not get bacterial vaginosis from toilet seats, bedding, swimming pools, or contact  with objects around them. SIGNS AND SYMPTOMS  Some women with bacterial vaginosis have no signs or symptoms. Common symptoms include:  Grey vaginal discharge.  A fishlike odor with discharge, especially after sexual intercourse.  Itching or burning of the vagina and vulva.  Burning or pain with urination. DIAGNOSIS  Your health care provider will take a medical history and examine the vagina for signs of bacterial vaginosis. A sample of vaginal fluid may be taken. Your health care provider will look at this sample under a microscope to check for bacteria and abnormal cells. A vaginal pH test may also be done.  TREATMENT  Bacterial vaginosis may be treated with antibiotic medicines. These may be given in the form of a pill or a vaginal cream. A second round of antibiotics may be prescribed if the condition comes back after treatment.  HOME CARE INSTRUCTIONS   Only take over-the-counter or prescription medicines as directed by your health care provider.  If antibiotic medicine was prescribed, take it as directed. Make sure you finish it even if you start to feel better.  Do not have sex until treatment is completed.  Tell all sexual partners that you have a vaginal infection. They should see their health care provider and be treated if they have problems, such as a mild rash or itching.  Practice safe sex by using condoms and only having one sex partner. SEEK MEDICAL CARE IF:   Your symptoms are not improving after 3 days of treatment.  You have increased discharge or pain.  You have a  fever. MAKE SURE YOU:   Understand these instructions.  Will watch your condition.  Will get help right away if you are not doing well or get worse. FOR MORE INFORMATION  Centers for Disease Control and Prevention, Division of STD Prevention: AppraiserFraud.fi American Sexual Health Association (ASHA): www.ashastd.org  Document Released: 01/10/2005 Document Revised: 10/31/2012 Document Reviewed:  08/22/2012 [facility] Patient Information 2015 [place], [place]. This information is not intended to replace advice given to you by your health care provider. Make sure you discuss any questions you have with your health care provider. Monilial Vaginitis Vaginitis in a soreness, swelling and redness (inflammation) of the vagina and vulva. Monilial vaginitis is not a sexually transmitted infection. CAUSES  Yeast vaginitis is caused by yeast (candida) that is normally found in your vagina. With a yeast infection, the candida has overgrown in number to a point that upsets the chemical balance. SYMPTOMS   White, thick vaginal discharge.  Swelling, itching, redness and irritation of the vagina and possibly the lips of the vagina (vulva).  Burning or painful urination.  Painful intercourse. DIAGNOSIS  Things that may contribute to monilial vaginitis are:  Postmenopausal and virginal states.  Pregnancy.  Infections.  Being tired, sick or stressed, especially if you had monilial vaginitis in the past.  Diabetes. Good control will help lower the chance.  Birth control pills.  Tight fitting garments.  Using bubble bath, feminine sprays, douches or deodorant tampons.  Taking certain medications that kill germs (antibiotics).  Sporadic recurrence can occur if you become ill. TREATMENT  Your caregiver will give you medication.  There are several kinds of anti monilial vaginal creams and suppositories specific for monilial vaginitis. For recurrent yeast infections, use a suppository or cream in the vagina 2 times a week, or as directed.  Anti-monilial or steroid cream for the itching or irritation of the vulva may also be used. Get your caregiver's permission.  Painting the vagina with methylene blue solution may help if the monilial cream does not work.  Eating yogurt may help prevent monilial vaginitis. HOME CARE INSTRUCTIONS   Finish all medication as prescribed.  Do not have sex  until treatment is completed or after your caregiver tells you it is okay.  Take warm sitz baths.  Do not douche.  Do not use tampons, especially scented ones.  Wear cotton underwear.  Avoid tight pants and panty hose.  Tell your sexual partner that you have a yeast infection. They should go to their caregiver if they have symptoms such as mild rash or itching.  Your sexual partner should be treated as well if your infection is difficult to eliminate.  Practice safer sex. Use condoms.  Some vaginal medications cause latex condoms to fail. Vaginal medications that harm condoms are:  Cleocin cream.  Butoconazole (Femstat).  Terconazole (Terazol) vaginal suppository.  Miconazole (Monistat) (may be purchased over the counter). SEEK MEDICAL CARE IF:   You have a temperature by mouth above 102 F (38.9 C).  The infection is getting worse after 2 days of treatment.  The infection is not getting better after 3 days of treatment.  You develop blisters in or around your vagina.  You develop vaginal bleeding, and it is not your menstrual period.  You have pain when you urinate.  You develop intestinal problems.  You have pain with sexual intercourse. Document Released: 10/20/2004 Document Revised: 04/04/2011 Document Reviewed: 07/04/2008 [facility] Patient Information 2015 [place], [place]. This information is not intended to replace advice given to you by  your health care provider. Make sure you discuss any questions you have with your health care provider.  

## 2014-09-11 NOTE — Progress Notes (Signed)
Sierra Mendoza is an 44 y.o. female. Who presented to the office today for her preop examination. Patient scheduled next week for hysteroscopic retrieval foreign body (embedded IUD that was placed 9 years ago in [place]) her history as follows:  The following is my office note from 08/21/2014: Patient is a 44 year old gravida 3 para 3 with past history of breast cancer was referred to our practice by her medical provider Windell Hummingbird PAC-C as a result of the inability removed IUD that patient had in for 9 years. Patient stated that this [place] approximate 9 [place]. She reports that the first 2 days of her cycle are heavy and then light for another 6 days. Patient's Pap smear are up-to-date as well as mammogram.  Review of her record indicated in April 2008 patient had a left lumpectomy and sentinel lymph node biopsy for a pT1c pN0, stage IA, invasive ductal carcinoma, grade 2, strongly Estrogen and progesterone receptor positive, HER-2/neu negative, with low MIB-1.Patient subsequently was treated with adjuvant therapy consisting of which she completed in 2008. Patient then was on tamoxifen from October 2008 until October 2013. She is currently being followed by her medical oncologist Dr. Gunnar Bulla Magrinat  Exam: Blood pressure 120/76 weight 125 pounds 5 feet 1 inches tall BMI 23.62 kg/m Pelvic: Bartholin urethra Skene was within normal limits Vagina: No lesions or discharge Cervix: No lesions or discharge IUD string not visualized Uterus: Anteverted normal size shape and consistency Adnexa: No palpable masses or tenderness Rectal vaginal exam not done  The cervix was cleansed with Betadine solution and a small work was introduced into the uterine cavity the string was not able to be retrieved to the level that it would be pulled out so the patient was taken to the ultrasound room and attempt to remove it under sonographic guidance.  A paracervical block 1% lidocaine was  infiltrated at the 2, 4, 8, and 10:00 position for totally cc. Several attempts with an intrauterine [place] as well as with a [place] clamp was unable to retrieve the IUD it appears to be partially embedded in the myometrium.  A report as follows: Uterus measured 14.2 x 6.9 x 5.2 cm with endometrial stripe of 13.2 mm retroflex uterus enlargement IUD was seen in the uterus triangular-shaped appears 1.7 mm wall of the left myometrium. Right ovary was normal. Left ovary corpus luteum cyst measuring 2.3 x 2.4 x 2.5 cm with color flow present the periphery anterior wall. Internal low level echo. No fluid in the cul-de-sac.  She is scheduled for hysteroscopic retrieval of IUD.  Pertinent Gynecological History: Menses: Regular Bleeding: None Contraception: IUD DES exposure: denies Blood transfusions: none Sexually transmitted diseases: no past history Previous GYN Procedures: 2 cesarean sections one vaginal delivery  Last mammogram: normal Date: 2016 Last pap: normal Date: 2015 OB History: G 3, P 3   Menstrual History: Menarche age: 9 Patient's last menstrual period was 07/29/2014.    Past Medical History  Diagnosis Date  . Cancer     Left Breast cancer; lumpectomy with radiation; chemotherapy.  . Migraines   . Frequent headaches   . Blood in stool   . Diabetes     pt states she does not have Diabetes    Past Surgical History  Procedure Laterality Date  . Cesarean section      2 c sections  . Breast surgery Left     s/p L lumpectomy for breast cancer followed by radiation,  chemotherapy.    Family History  Problem Relation Age of Onset  . Hypertension Father     Social History:  reports that she has never smoked. She has never used smokeless tobacco. She reports that she does not drink alcohol. Her drug history is not on file.  Allergies: No Known Allergies   (Not in a hospital admission)  REVIEW OF SYSTEMS: A ROS was performed and pertinent positives and negatives are  included in the history.  GENERAL: No fevers or chills. HEENT: No change in vision, no earache, sore throat or sinus congestion. NECK: No pain or stiffness. CARDIOVASCULAR: No chest pain or pressure. No palpitations. PULMONARY: No shortness of breath, cough or wheeze. GASTROINTESTINAL: No abdominal pain, nausea, vomiting or diarrhea, melena or bright red blood per rectum. GENITOURINARY: No urinary frequency, urgency, hesitancy or dysuria. MUSCULOSKELETAL: No joint or muscle pain, no back pain, no recent trauma. DERMATOLOGIC: No rash, no itching, no lesions. ENDOCRINE: No polyuria, polydipsia, no heat or cold intolerance. No recent change in weight. HEMATOLOGICAL: No anemia or easy bruising or bleeding. NEUROLOGIC: No headache, seizures, numbness, tingling or weakness. PSYCHIATRIC: No depression, no loss of interest in normal activity or change in sleep pattern.     Blood pressure 110/80, height _0  (1.549 m), weight 125 lb (56.7 kg), last menstrual period 07/29/2014.  Physical Exam:  HEENT:unremarkable Neck:Supple, midline, no thyroid megaly, no carotid bruits Lungs:  Clear to auscultation no rhonchi's or wheezes Heart:Regular rate and rhythm, no murmurs or gallops Breast Exam: Symmetrical in appearance no palpable masses or tenderness Abdomen: Soft nontender no rebound or guarding Pelvic:BUS within normal limits Vagina: Thick white discharge noted Cervix: Same as above Uterus: Anteverted normal size shape and consistency Adnexa: No palpable masses or tenderness Extremities: No cords, no edema Rectal: Not examined  No results found for this or any previous visit (from the past 24 hour(s)).  No results found.  Assessment/Plan: With 9 year history of IUD having been placed in [place]. We have been unable to retrieve the IUD in the office but it was seen by ultrasound. Patient scheduled to undergo hysteroscopic retrieval of IUD. Risks benefits and pros and cons of the operation discussed  the patient and with a translator present and the following was outlined:                        Patient was counseled as to the risk of surgery to include the following:  1. Infection (prohylactic antibiotics will be administered)  2. DVT/Pulmonary Embolism (prophylactic pneumo compression stockings will be used)  3.Trauma to internal organs requiring additional surgical procedure to repair any injury to     Internal organs requiring perhaps additional hospitalization days.  4.Hemmorhage requiring transfusion and blood products which carry risks such as             anaphylactic reaction, hepatitis and AIDS  Patient had received literature information on the procedure scheduled and all her questions were answered and fully accepts all risk.  A wet prep was done because of the white discharge was noted and it came back yeast along with BV. She will be treated with Tindamax 500 mg tablets. She will take 4 tablets today and repeat in 24 hours. She was also prescribed Diflucan 150 mg one by mouth.   [facility] [id number]     Terrance Mass 09/11/2014, 12:49 PM  Note: This dictation was prepared with  Dragon/digital dictation along withSmart phrase technology. Any transcriptional  errors that result from this process are unintentional.

## 2014-09-15 ENCOUNTER — Encounter (HOSPITAL_COMMUNITY): Payer: Self-pay | Admitting: Anesthesiology

## 2014-09-15 MED ORDER — DEXTROSE 5 % IV SOLN
2.0000 g | INTRAVENOUS | Status: AC
  Administered 2014-09-16: 2 g via INTRAVENOUS
  Filled 2014-09-15: qty 2

## 2014-09-15 NOTE — Anesthesia Preprocedure Evaluation (Addendum)
Anesthesia Evaluation    Airway Mallampati: III  TM Distance: >3 FB Neck ROM: Full    Dental no notable dental hx. (+) Teeth Intact   Pulmonary neg pulmonary ROS,  breath sounds clear to auscultation  Pulmonary exam normal       Cardiovascular negative cardio ROS Normal cardiovascular examRhythm:Regular Rate:Normal     Neuro/Psych  Headaches, negative psych ROS   GI/Hepatic negative GI ROS, Neg liver ROS,   Endo/Other  neg diabetesHx/o Breast Ca  Renal/GU negative Renal ROS  negative genitourinary   Musculoskeletal negative musculoskeletal ROS (+)   Abdominal   Peds  Hematology   Anesthesia Other Findings   Reproductive/Obstetrics Embedded IUD                              Anesthesia Physical Anesthesia Plan  ASA: II  Anesthesia Plan: General   Post-op Pain Management:    Induction: Intravenous  Airway Management Planned: LMA  Additional Equipment:   Intra-op Plan:   Post-operative Plan: Extubation in OR  Informed Consent: I have reviewed the patients History and Physical, chart, labs and discussed the procedure including the risks, benefits and alternatives for the proposed anesthesia with the patient or authorized representative who has indicated his/her understanding and acceptance.   Dental advisory given  Plan Discussed with: CRNA, Anesthesiologist and Surgeon  Anesthesia Plan Comments:         Anesthesia Quick Evaluation

## 2014-09-16 ENCOUNTER — Encounter (HOSPITAL_COMMUNITY)
Admission: RE | Disposition: A | Discharge status: Home / Self Care | Payer: Self-pay | Source: Ambulatory Visit | Attending: Gynecology

## 2014-09-16 ENCOUNTER — Ambulatory Visit (HOSPITAL_COMMUNITY)
Admission: RE | Admit: 2014-09-16 | Discharge: 2014-09-16 | Disposition: A | Discharge status: Home / Self Care | Payer: BLUE CROSS/BLUE SHIELD | Source: Ambulatory Visit | Attending: Gynecology | Admitting: Gynecology

## 2014-09-16 ENCOUNTER — Encounter (HOSPITAL_COMMUNITY): Payer: Self-pay | Admitting: *Deleted

## 2014-09-16 ENCOUNTER — Ambulatory Visit (HOSPITAL_COMMUNITY): Payer: BLUE CROSS/BLUE SHIELD | Admitting: Anesthesiology

## 2014-09-16 DIAGNOSIS — T8339XA Other mechanical complication of intrauterine contraceptive device, initial encounter: Secondary | ICD-10-CM | POA: Diagnosis present

## 2014-09-16 DIAGNOSIS — N831 Corpus luteum cyst: Secondary | ICD-10-CM | POA: Diagnosis not present

## 2014-09-16 DIAGNOSIS — Z853 Personal history of malignant neoplasm of breast: Secondary | ICD-10-CM | POA: Insufficient documentation

## 2014-09-16 DIAGNOSIS — Z189 Retained foreign body fragments, unspecified material: Secondary | ICD-10-CM | POA: Diagnosis not present

## 2014-09-16 DIAGNOSIS — N854 Malposition of uterus: Secondary | ICD-10-CM | POA: Insufficient documentation

## 2014-09-16 HISTORY — PX: HYSTEROSCOPY WITH RESECTOSCOPE: SHX5395

## 2014-09-16 LAB — CBC
HEMATOCRIT: 35.5 % — AB (ref 36.0–46.0)
Hemoglobin: 11.7 g/dL — ABNORMAL LOW (ref 12.0–15.0)
MCH: 29.3 pg (ref 26.0–34.0)
MCHC: 33 g/dL (ref 30.0–36.0)
MCV: 88.8 fL (ref 78.0–100.0)
Platelets: 292 10*3/uL (ref 150–400)
RBC: 4 MIL/uL (ref 3.87–5.11)
RDW: 13.8 % (ref 11.5–15.5)
WBC: 6 10*3/uL (ref 4.0–10.5)

## 2014-09-16 LAB — TYPE AND SCREEN
ABO/RH(D): O POS
Antibody Screen: NEGATIVE

## 2014-09-16 LAB — PREGNANCY, URINE: Preg Test, Ur: NEGATIVE

## 2014-09-16 LAB — ABO/RH: ABO/RH(D): O POS

## 2014-09-16 SURGERY — HYSTEROSCOPY, USING RESECTOSCOPE
Anesthesia: General

## 2014-09-16 MED ORDER — IBUPROFEN 800 MG PO TABS: 800.0000 mg | ORAL_TABLET | Freq: Three times a day (TID) | ORAL | Status: AC | PRN

## 2014-09-16 MED ORDER — KETOROLAC TROMETHAMINE 30 MG/ML IJ SOLN
INTRAMUSCULAR | Status: AC
  Filled 2014-09-16: qty 1

## 2014-09-16 MED ORDER — ONDANSETRON HCL 4 MG/2ML IJ SOLN
INTRAMUSCULAR | Status: DC | PRN
  Administered 2014-09-16: 4 mg via INTRAVENOUS

## 2014-09-16 MED ORDER — FENTANYL CITRATE (PF) 250 MCG/5ML IJ SOLN
INTRAMUSCULAR | Status: AC
  Filled 2014-09-16: qty 25

## 2014-09-16 MED ORDER — DEXAMETHASONE SODIUM PHOSPHATE 4 MG/ML IJ SOLN
INTRAMUSCULAR | Status: AC
  Filled 2014-09-16: qty 1

## 2014-09-16 MED ORDER — ONDANSETRON HCL 4 MG/2ML IJ SOLN
INTRAMUSCULAR | Status: AC
Start: 2014-09-16 — End: 2014-09-16
  Filled 2014-09-16: qty 2

## 2014-09-16 MED ORDER — FENTANYL CITRATE (PF) 100 MCG/2ML IJ SOLN: 25.0000 ug | INTRAMUSCULAR | Status: DC | PRN

## 2014-09-16 MED ORDER — SCOPOLAMINE 1 MG/3DAYS TD PT72
1.0000 | MEDICATED_PATCH | Freq: Once | TRANSDERMAL | Status: DC
  Administered 2014-09-16: 1.5 mg via TRANSDERMAL

## 2014-09-16 MED ORDER — DEXAMETHASONE SODIUM PHOSPHATE 10 MG/ML IJ SOLN
INTRAMUSCULAR | Status: DC | PRN
  Administered 2014-09-16: 4 mg via INTRAVENOUS

## 2014-09-16 MED ORDER — SCOPOLAMINE 1 MG/3DAYS TD PT72
MEDICATED_PATCH | TRANSDERMAL | Status: AC
  Administered 2014-09-16: 1.5 mg via TRANSDERMAL
  Filled 2014-09-16: qty 1

## 2014-09-16 MED ORDER — KETOROLAC TROMETHAMINE 30 MG/ML IJ SOLN: 30.0000 mg | Freq: Once | INTRAMUSCULAR | Status: DC | PRN

## 2014-09-16 MED ORDER — LIDOCAINE HCL (CARDIAC) 20 MG/ML IV SOLN
INTRAVENOUS | Status: AC
  Filled 2014-09-16: qty 5

## 2014-09-16 MED ORDER — MIDAZOLAM HCL 2 MG/2ML IJ SOLN
INTRAMUSCULAR | Status: AC
  Filled 2014-09-16: qty 4

## 2014-09-16 MED ORDER — LIDOCAINE HCL (CARDIAC) 20 MG/ML IV SOLN
INTRAVENOUS | Status: DC | PRN
  Administered 2014-09-16: 100 mg via INTRAVENOUS

## 2014-09-16 MED ORDER — KETOROLAC TROMETHAMINE 30 MG/ML IJ SOLN
INTRAMUSCULAR | Status: DC | PRN
Start: 2014-09-16 — End: 2014-09-16
  Administered 2014-09-16: 30 mg via INTRAVENOUS

## 2014-09-16 MED ORDER — ONDANSETRON HCL 4 MG/2ML IJ SOLN: 4.0000 mg | Freq: Once | INTRAMUSCULAR | Status: DC | PRN

## 2014-09-16 MED ORDER — GLYCINE 1.5 % IR SOLN
Status: DC | PRN
  Administered 2014-09-16: 3000 mL

## 2014-09-16 MED ORDER — PROPOFOL 10 MG/ML IV BOLUS
INTRAVENOUS | Status: AC
  Filled 2014-09-16: qty 20

## 2014-09-16 MED ORDER — MIDAZOLAM HCL 2 MG/2ML IJ SOLN
INTRAMUSCULAR | Status: DC | PRN
  Administered 2014-09-16: 2 mg via INTRAVENOUS

## 2014-09-16 MED ORDER — PROPOFOL 10 MG/ML IV BOLUS
INTRAVENOUS | Status: DC | PRN
  Administered 2014-09-16: 150 mg via INTRAVENOUS

## 2014-09-16 MED ORDER — LACTATED RINGERS IV SOLN
INTRAVENOUS | Status: DC
  Administered 2014-09-16: 07:00:00 via INTRAVENOUS

## 2014-09-16 MED ORDER — MEPERIDINE HCL 25 MG/ML IJ SOLN: 6.2500 mg | INTRAMUSCULAR | Status: DC | PRN

## 2014-09-16 MED ORDER — FENTANYL CITRATE (PF) 100 MCG/2ML IJ SOLN
INTRAMUSCULAR | Status: DC | PRN
  Administered 2014-09-16: 50 ug via INTRAVENOUS

## 2014-09-16 SURGICAL SUPPLY — 24 items
CANISTER SUCT 3000ML (MISCELLANEOUS) ×3 IMPLANT
CATH ROBINSON RED A/P 16FR (CATHETERS) ×3 IMPLANT
CLOTH BEACON ORANGE TIMEOUT ST (SAFETY) ×3 IMPLANT
CONTAINER PREFILL 10% NBF 60ML (FORM) ×6 IMPLANT
CORD ACTIVE DISPOSABLE (ELECTRODE) ×2
CORD ELECTRO ACTIVE DISP (ELECTRODE) ×1 IMPLANT
ELECT LOOP GYNE PRO 24FR (CUTTING LOOP)
ELECT REM PT RETURN 9FT ADLT (ELECTROSURGICAL) ×3
ELECT VAPORTRODE GRVD BAR (ELECTRODE) IMPLANT
ELECTRODE LOOP GYNE PRO 24FR (CUTTING LOOP) IMPLANT
ELECTRODE REM PT RTRN 9FT ADLT (ELECTROSURGICAL) ×1 IMPLANT
GLOVE BIOGEL PI IND STRL 8 (GLOVE) ×1 IMPLANT
GLOVE BIOGEL PI INDICATOR 8 (GLOVE) ×2
GLOVE ECLIPSE 7.5 STRL STRAW (GLOVE) ×6 IMPLANT
GOWN STRL REUS W/TWL LRG LVL3 (GOWN DISPOSABLE) ×6 IMPLANT
PACK VAGINAL MINOR WOMEN LF (CUSTOM PROCEDURE TRAY) ×3 IMPLANT
PAD OB MATERNITY 4.3X12.25 (PERSONAL CARE ITEMS) ×3 IMPLANT
PAD PREP 24X48 CUFFED NSTRL (MISCELLANEOUS) ×3 IMPLANT
ROLLER BALL ANGLED (ELECTRODE) IMPLANT
ROLLER BAR ANGLED (ELECTRODE) IMPLANT
TOWEL OR 17X24 6PK STRL BLUE (TOWEL DISPOSABLE) ×6 IMPLANT
TUBING AQUILEX INFLOW (TUBING) ×3 IMPLANT
TUBING AQUILEX OUTFLOW (TUBING) ×3 IMPLANT
WATER STERILE IRR 1000ML POUR (IV SOLUTION) ×3 IMPLANT

## 2014-09-16 NOTE — Op Note (Signed)
   Operative Note  09/16/2014  10:03 AM  PATIENT:  Sierra Mendoza  44 y.o. female  PRE-OPERATIVE DIAGNOSIS:  EMBEDDED IUD  POST-OPERATIVE DIAGNOSIS:  EMBEDDED IUD  PROCEDURE:  Procedure(s): HYSTEROSCOPY WITH RESECTOSCOPE  SURGEON:  Surgeon(s): Terrance Mass, MD  ANESTHESIA:   general  FINDINGS: A triangular medical shaped IUD was noted in the uterine cavity and both of the tubes were embedded into the tubal ostia bilateral  DESCRIPTION OF OPERATION: The patient was taken to the operating room where she underwent successful general endotracheal anesthesia. A timeout was undertaken to properly identify the patient discuss out loud the case to be performed. Patient had received 2 g of Cefotan for prophylaxis and also had PSA stockings for DVT prophylaxis. Patient was placed in high lithotomy position the vagina and perineum were prepped and draped in usual sterile fashion. A red rubber Robinson catheter was introduced into the the bladder to empty its contents for approximately 75 cc. Examination under anesthesia demonstrated a slightly retroverted uterus normal size shape and consistency no palpable adnexal masses. A gray speculum was introduced into the vagina. A single-tooth tenaculum was placed on the anterior cervical lip. The uterus sounded to approximately 7 cm. The operative hysteroscope was introduced into the uterine cavity and normal saline was the distending media. It was at this time that a triangular-shaped IUD with metal coils noted embedded in both tubal ostia. The operating room, met was introduced to the operative hysteroscope but was not wide enough to grasp the metal foreign body so with the use of Kelly clamp introduced into the uterine cavity we were able to grasp the IUD and retrieve it out of the uterine cavity. Pictures were obtained which we share with the patient. There was no bleeding. Patient tolerated procedure well the single-tooth tenaculum was removed patient was  extubated and transferred to recovery room stable vital signs. Distending media fluid deficit was less than 500 cc.  ESTIMATED BLOOD LOSS: Minimal  Intake/Output Summary (Last 24 hours) at 09/16/14 1003 Last data filed at 09/16/14 0827  Gross per 24 hour  Intake   1100 ml  Output     80 ml  Net   1020 ml     BLOOD ADMINISTERED:none   LOCAL MEDICATIONS USED:  NONE  SPECIMEN:  Source of Specimen:  IUD formed body retrieved  DISPOSITION OF SPECIMEN:  N/A  COUNTS:  YES  PLAN OF CARE: Transfer to PACU  [facility] HMD10:03 AMTD@

## 2014-09-16 NOTE — Transfer of Care (Signed)
Immediate Anesthesia Transfer of Care Note  Patient: Sierra Mendoza  Procedure(s) Performed: Procedure(s): HYSTEROSCOPY WITH RESECTOSCOPE (N/A)  Patient Location: PACU  Anesthesia Type:General  Level of Consciousness: awake  Airway & Oxygen Therapy: Patient Spontanous Breathing  Post-op Assessment: Report given to PACU RN  Post vital signs: stable  Filed Vitals:   09/16/14 0623  BP: 131/83  Pulse: 74  Temp: 36.5 C  Resp: 18    Complications: No apparent anesthesia complications

## 2014-09-16 NOTE — Interval H&P Note (Signed)
History and Physical Interval Note:  09/16/2014 7:22 AM  Sierra Mendoza  has presented today for surgery, with the diagnosis of EMBEDDED IUD  The various methods of treatment have been discussed with the patient and family. After consideration of risks, benefits and other options for treatment, the patient has consented to  Procedure(s): HYSTEROSCOPY WITH RESECTOSCOPE (N/A) as a surgical intervention .  The patient's history has been reviewed, patient examined, no change in status, stable for surgery.  I have reviewed the patient's chart and labs.  Questions were answered to the patient's satisfaction.     Terrance Mass

## 2014-09-16 NOTE — H&P (View-Only) (Signed)
 Sierra Mendoza is an 44 y.o. female. Who presented to the office today for her preop examination. Patient scheduled next week for hysteroscopic retrieval foreign body (embedded IUD that was placed 9 years ago in [place]) her history as follows:  The following is my office note from 08/21/2014: Patient is a 44-year-old gravida 3 para 3 with past history of breast cancer was referred to our practice by her medical provider Sarah Weber PAC-C as a result of the inability removed IUD that patient had in for 9 years. Patient stated that this [place]UD [place] approximate 9 [place]. She reports that the first 2 days of her cycle are heavy and then light for another 6 days. Patient's Pap smear are up-to-date as well as mammogram.  Review of her record indicated in April 2008 patient had a left lumpectomy and sentinel lymph node biopsy for a pT1c pN0, stage IA, invasive ductal carcinoma, grade 2, strongly Estrogen and progesterone receptor positive, HER-2/neu negative, with low MIB-1.Patient subsequently was treated with adjuvant therapy consisting of which she completed in 2008. Patient then was on tamoxifen from October 2008 until October 2013. She is currently being followed by her medical oncologist Dr. Gus Magrinat  Exam: Blood pressure 120/76 weight 125 pounds 5 feet 1 inches tall BMI 23.62 kg/m Pelvic: Bartholin urethra Skene was within normal limits Vagina: No lesions or discharge Cervix: No lesions or discharge IUD string not visualized Uterus: Anteverted normal size shape and consistency Adnexa: No palpable masses or tenderness Rectal vaginal exam not done  The cervix was cleansed with Betadine solution and a small work was introduced into the uterine cavity the string was not able to be retrieved to the level that it would be pulled out so the patient was taken to the ultrasound room and attempt to remove it under sonographic guidance.  A paracervical block 1% lidocaine was  infiltrated at the 2, 4, 8, and 10:00 position for totally cc. Several attempts with an intrauterine [place] as well as with a [place] clamp was unable to retrieve the IUD it appears to be partially embedded in the myometrium.  A report as follows: Uterus measured 14.2 x 6.9 x 5.2 cm with endometrial stripe of 13.2 mm retroflex uterus enlargement IUD was seen in the uterus triangular-shaped appears 1.7 mm wall of the left myometrium. Right ovary was normal. Left ovary corpus luteum cyst measuring 2.3 x 2.4 x 2.5 cm with color flow present the periphery anterior wall. Internal low level echo. No fluid in the cul-de-sac.  She is scheduled for hysteroscopic retrieval of IUD.  Pertinent Gynecological History: Menses: Regular Bleeding: None Contraception: IUD DES exposure: denies Blood transfusions: none Sexually transmitted diseases: no past history Previous GYN Procedures: 2 cesarean sections one vaginal delivery  Last mammogram: normal Date: 2016 Last pap: normal Date: 2015 OB History: G 3, P 3   Menstrual History: Menarche age: 9 Patient's last menstrual period was 07/29/2014.    Past Medical History  Diagnosis Date  . Cancer     Left Breast cancer; lumpectomy with radiation; chemotherapy.  . Migraines   . Frequent headaches   . Blood in stool   . Diabetes     pt states she does not have Diabetes    Past Surgical History  Procedure Laterality Date  . Cesarean section      2 c sections  . Breast surgery Left     s/p L lumpectomy for breast cancer followed by radiation,   chemotherapy.    Family History  Problem Relation Age of Onset  . Hypertension Father     Social History:  reports that she has never smoked. She has never used smokeless tobacco. She reports that she does not drink alcohol. Her drug history is not on file.  Allergies: No Known Allergies   (Not in a hospital admission)  REVIEW OF SYSTEMS: A ROS was performed and pertinent positives and negatives are  included in the history.  GENERAL: No fevers or chills. HEENT: No change in vision, no earache, sore throat or sinus congestion. NECK: No pain or stiffness. CARDIOVASCULAR: No chest pain or pressure. No palpitations. PULMONARY: No shortness of breath, cough or wheeze. GASTROINTESTINAL: No abdominal pain, nausea, vomiting or diarrhea, melena or bright red blood per rectum. GENITOURINARY: No urinary frequency, urgency, hesitancy or dysuria. MUSCULOSKELETAL: No joint or muscle pain, no back pain, no recent trauma. DERMATOLOGIC: No rash, no itching, no lesions. ENDOCRINE: No polyuria, polydipsia, no heat or cold intolerance. No recent change in weight. HEMATOLOGICAL: No anemia or easy bruising or bleeding. NEUROLOGIC: No headache, seizures, numbness, tingling or weakness. PSYCHIATRIC: No depression, no loss of interest in normal activity or change in sleep pattern.     Blood pressure 110/80, height 5' 1" (1.549 m), weight 125 lb (56.7 kg), last menstrual period 07/29/2014.  Physical Exam:  HEENT:unremarkable Neck:Supple, midline, no thyroid megaly, no carotid bruits Lungs:  Clear to auscultation no rhonchi's or wheezes Heart:Regular rate and rhythm, no murmurs or gallops Breast Exam: Symmetrical in appearance no palpable masses or tenderness Abdomen: Soft nontender no rebound or guarding Pelvic:BUS within normal limits Vagina: Thick white discharge noted Cervix: Same as above Uterus: Anteverted normal size shape and consistency Adnexa: No palpable masses or tenderness Extremities: No cords, no edema Rectal: Not examined  No results found for this or any previous visit (from the past 24 hour(s)).  No results found.  Assessment/Plan: With 9 year history of IUD having been placed in [place]. We have been unable to retrieve the IUD in the office but it was seen by ultrasound. Patient scheduled to undergo hysteroscopic retrieval of IUD. Risks benefits and pros and cons of the operation discussed  the patient and with a translator present and the following was outlined:                        Patient was counseled as to the risk of surgery to include the following:  1. Infection (prohylactic antibiotics will be administered)  2. DVT/Pulmonary Embolism (prophylactic pneumo compression stockings will be used)  3.Trauma to internal organs requiring additional surgical procedure to repair any injury to     Internal organs requiring perhaps additional hospitalization days.  4.Hemmorhage requiring transfusion and blood products which carry risks such as             anaphylactic reaction, hepatitis and AIDS  Patient had received literature information on the procedure scheduled and all her questions were answered and fully accepts all risk.  A wet prep was done because of the white discharge was noted and it came back yeast along with BV. She will be treated with Tindamax 500 mg tablets. She will take 4 tablets today and repeat in 24 hours. She was also prescribed Diflucan 150 mg one by mouth.   FERNANDEZ,JUAN HMD12:58 PMTD@Note    FERNANDEZ,JUAN H 09/11/2014, 12:49 PM  Note: This dictation was prepared with  Dragon/digital dictation along withSmart phrase technology. Any transcriptional   errors that result from this process are unintentional.

## 2014-09-16 NOTE — Discharge Instructions (Signed)

## 2014-09-16 NOTE — Anesthesia Procedure Notes (Signed)
Procedure Name: LMA Insertion Date/Time: 09/16/2014 7:34 AM Performed by: Casimer Lanius A Pre-anesthesia Checklist: Patient being monitored, Patient identified, Emergency Drugs available and Suction available Patient Re-evaluated:Patient Re-evaluated prior to inductionOxygen Delivery Method: Circle system utilized Preoxygenation: Pre-oxygenation with 100% oxygen Intubation Type: IV induction and Inhalational induction Ventilation: Mask ventilation without difficulty LMA: LMA inserted LMA Size: 4.0 Number of attempts: 1 Dental Injury: Teeth and Oropharynx as per pre-operative assessment

## 2014-09-16 NOTE — Anesthesia Postprocedure Evaluation (Signed)
Anesthesia Post Note  Patient: Sierra Mendoza  Procedure(s) Performed: Procedure(s) (LRB): HYSTEROSCOPY WITH RESECTOSCOPE (N/A)  Anesthesia type: General  Patient location: PACU  Post pain: Pain level controlled  Post assessment: Post-op Vital signs reviewed  Last Vitals:  Filed Vitals:   09/16/14 0845  BP: 116/74  Pulse: 72  Temp:   Resp: 16    Post vital signs: Reviewed  Level of consciousness: sedated  Complications: No apparent anesthesia complications

## 2014-09-17 ENCOUNTER — Encounter (HOSPITAL_COMMUNITY): Payer: Self-pay | Admitting: Gynecology

## 2014-10-04 ENCOUNTER — Other Ambulatory Visit: Payer: Self-pay | Admitting: Oncology

## 2014-11-20 ENCOUNTER — Other Ambulatory Visit: Payer: Self-pay | Admitting: Oncology

## 2015-01-12 ENCOUNTER — Other Ambulatory Visit: Payer: Self-pay | Admitting: Oncology

## 2015-03-02 ENCOUNTER — Other Ambulatory Visit: Payer: Self-pay | Admitting: Oncology

## 2015-05-11 ENCOUNTER — Other Ambulatory Visit: Payer: Self-pay | Admitting: Oncology

## 2015-05-22 ENCOUNTER — Ambulatory Visit
Admission: RE | Admit: 2015-05-22 | Discharge: 2015-05-22 | Disposition: A | Discharge status: Home / Self Care | Payer: BLUE CROSS/BLUE SHIELD | Source: Ambulatory Visit | Attending: Oncology | Admitting: Oncology

## 2015-05-22 ENCOUNTER — Other Ambulatory Visit: Payer: Self-pay | Admitting: Oncology

## 2015-05-22 DIAGNOSIS — C50919 Malignant neoplasm of unspecified site of unspecified female breast: Secondary | ICD-10-CM

## 2015-05-27 ENCOUNTER — Other Ambulatory Visit: Payer: Self-pay

## 2015-05-27 DIAGNOSIS — C50912 Malignant neoplasm of unspecified site of left female breast: Secondary | ICD-10-CM

## 2015-05-28 ENCOUNTER — Other Ambulatory Visit (HOSPITAL_BASED_OUTPATIENT_CLINIC_OR_DEPARTMENT_OTHER): Payer: BLUE CROSS/BLUE SHIELD

## 2015-05-28 DIAGNOSIS — Z853 Personal history of malignant neoplasm of breast: Secondary | ICD-10-CM | POA: Diagnosis not present

## 2015-05-28 DIAGNOSIS — C50912 Malignant neoplasm of unspecified site of left female breast: Secondary | ICD-10-CM

## 2015-05-28 LAB — CBC WITH DIFFERENTIAL/PLATELET
BASO%: 1.6 % (ref 0.0–2.0)
BASOS ABS: 0.1 10*3/uL (ref 0.0–0.1)
EOS ABS: 0.1 10*3/uL (ref 0.0–0.5)
EOS%: 2 % (ref 0.0–7.0)
HEMATOCRIT: 34.9 % (ref 34.8–46.6)
HEMOGLOBIN: 11.6 g/dL (ref 11.6–15.9)
LYMPH#: 1.8 10*3/uL (ref 0.9–3.3)
LYMPH%: 40.6 % (ref 14.0–49.7)
MCH: 28.6 pg (ref 25.1–34.0)
MCHC: 33.2 g/dL (ref 31.5–36.0)
MCV: 86.2 fL (ref 79.5–101.0)
MONO#: 0.4 10*3/uL (ref 0.1–0.9)
MONO%: 8.7 % (ref 0.0–14.0)
NEUT%: 47.1 % (ref 38.4–76.8)
NEUTROS ABS: 2.1 10*3/uL (ref 1.5–6.5)
PLATELETS: 275 10*3/uL (ref 145–400)
RBC: 4.05 10*6/uL (ref 3.70–5.45)
RDW: 14 % (ref 11.2–14.5)
WBC: 4.5 10*3/uL (ref 3.9–10.3)

## 2015-05-28 LAB — COMPREHENSIVE METABOLIC PANEL
ALBUMIN: 3.8 g/dL (ref 3.5–5.0)
ALK PHOS: 54 U/L (ref 40–150)
ALT: 11 U/L (ref 0–55)
ANION GAP: 7 meq/L (ref 3–11)
AST: 17 U/L (ref 5–34)
BILIRUBIN TOTAL: 0.65 mg/dL (ref 0.20–1.20)
BUN: 8.8 mg/dL (ref 7.0–26.0)
CALCIUM: 8.9 mg/dL (ref 8.4–10.4)
CO2: 27 mEq/L (ref 22–29)
Chloride: 107 mEq/L (ref 98–109)
Creatinine: 0.8 mg/dL (ref 0.6–1.1)
GLUCOSE: 89 mg/dL (ref 70–140)
POTASSIUM: 4.2 meq/L (ref 3.5–5.1)
SODIUM: 141 meq/L (ref 136–145)
TOTAL PROTEIN: 6.6 g/dL (ref 6.4–8.3)

## 2015-06-01 IMAGING — MG MM DIAG BREAST TOMO UNI RIGHT
4 series · 4 of 12 positions shown · non-contrast
Comparison: 05/05/2014, additional prior studies dating back to
04/07/2008

CLINICAL DATA: 44-year-old female, callback from screening
mammogram for possible right breast asymmetry. The patient reports
that she is still concerned about a lump in her lower, outer left
breast for which she was previously evaluated in 7829.

EXAM:
DIGITAL DIAGNOSTIC RIGHT MAMMOGRAM WITH 3D TOMOSYNTHESIS WITH CAD
ULTRASOUND RIGHT BREAST

[R CC]
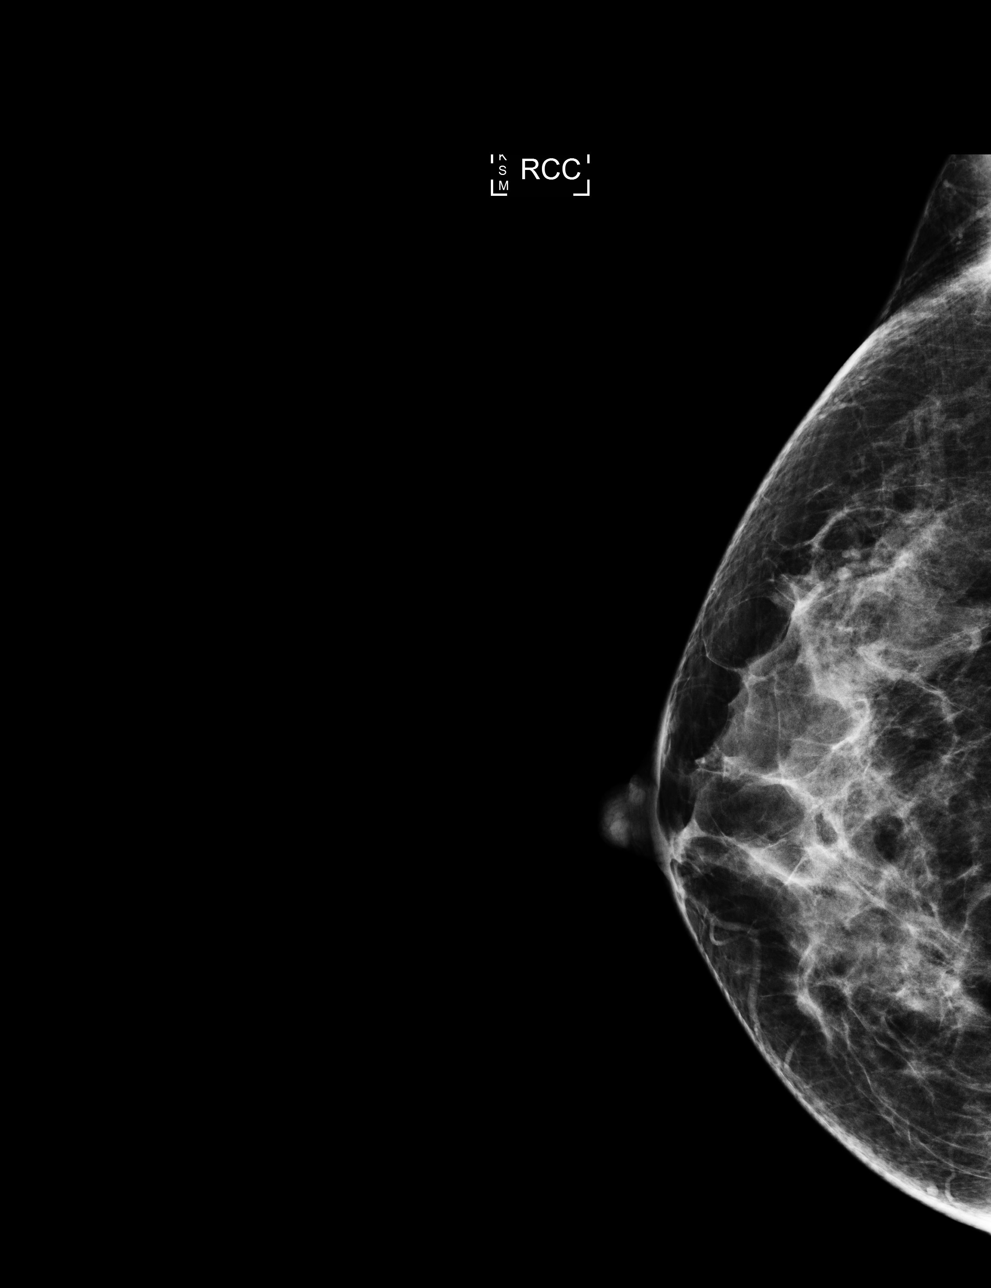

[R MLO]
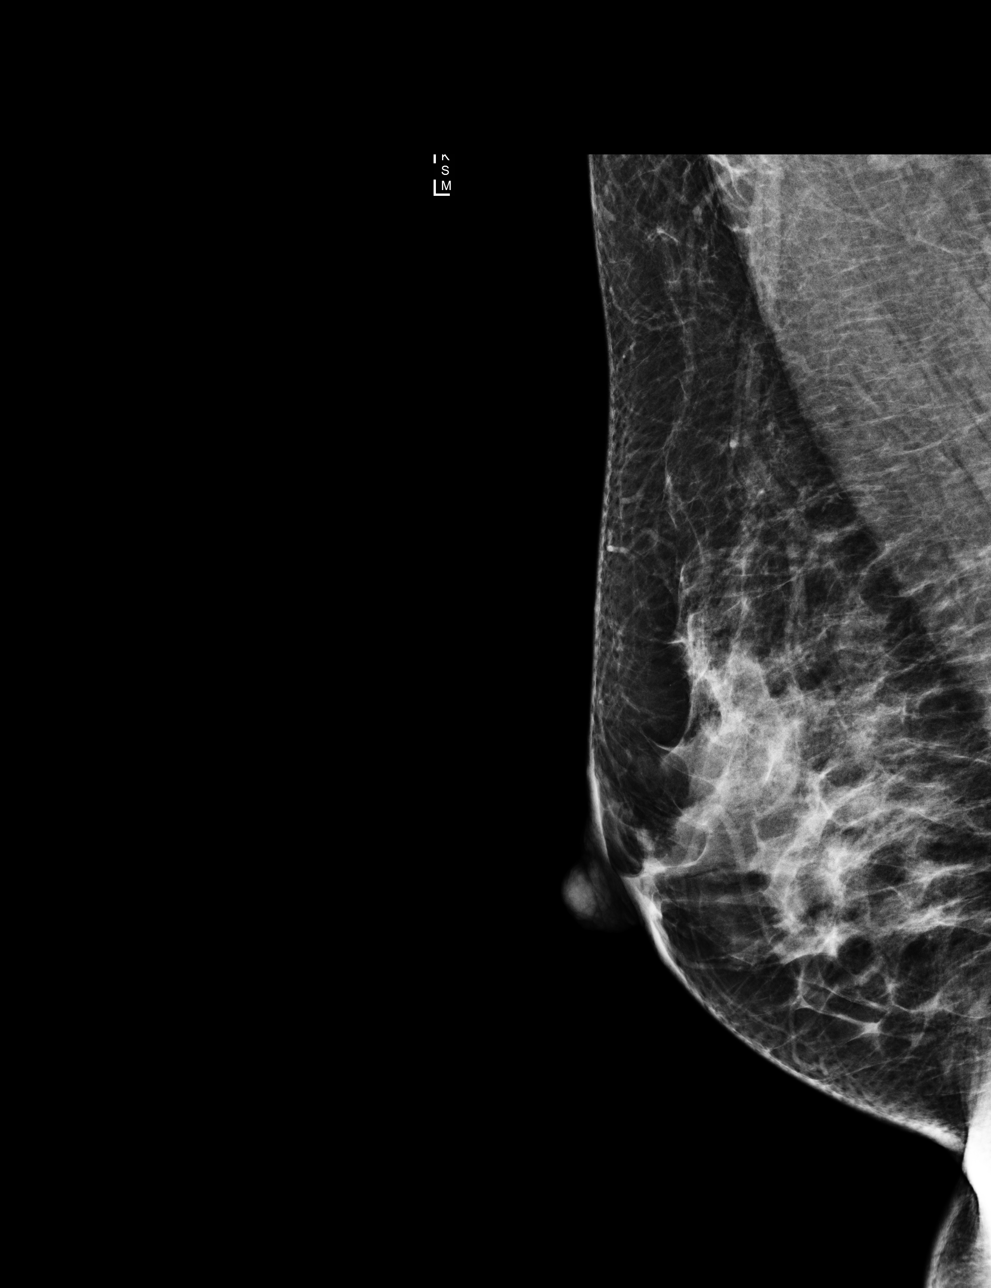

[R CC tomo · tomo slice 31/62.0]
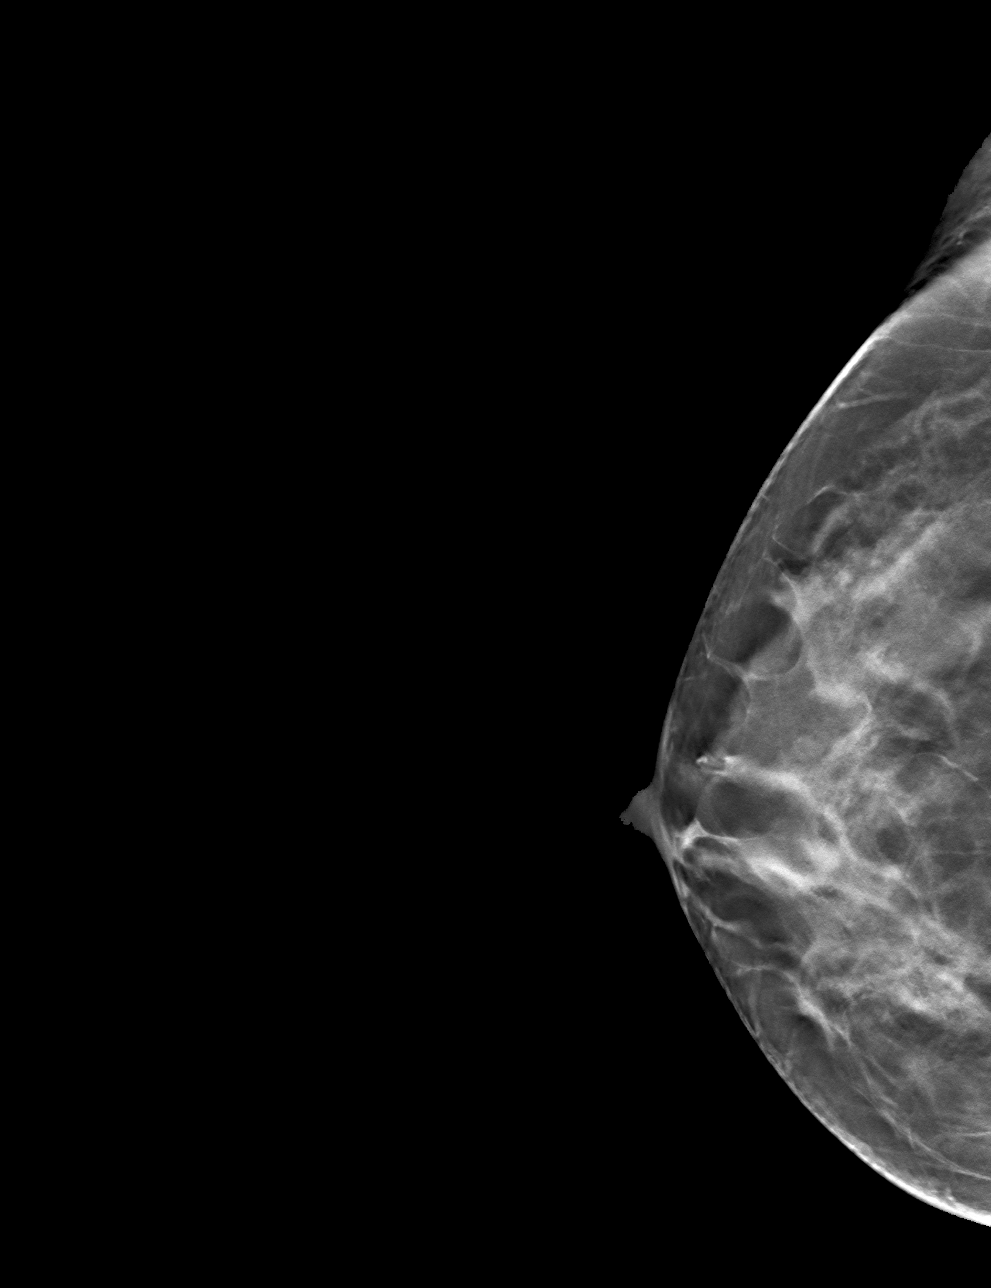

[R MLO tomo · tomo slice 33/65.0]
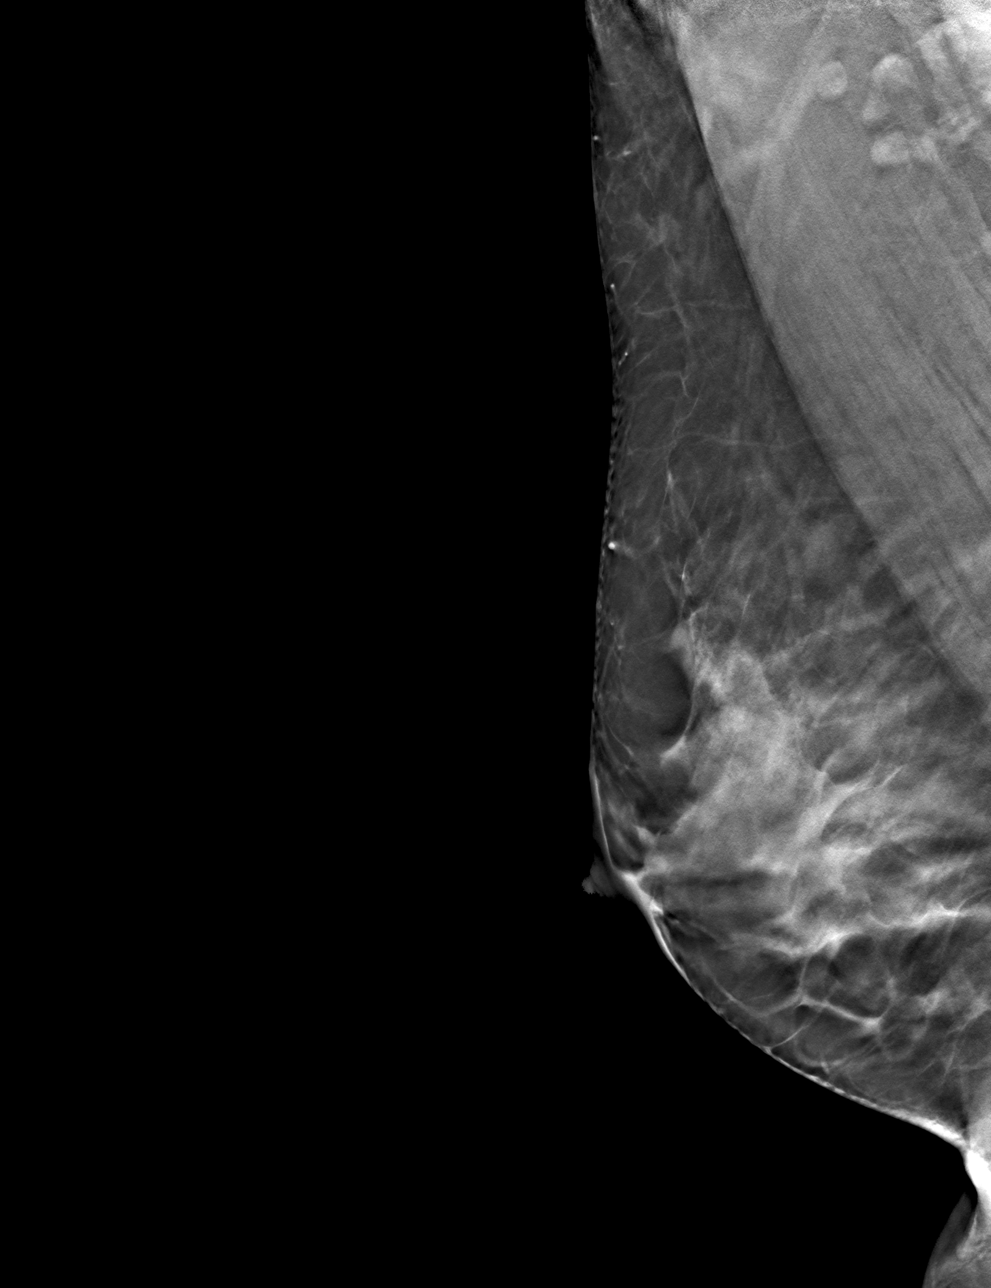

[4 of 12 positions shown; findings below may reference images not displayed]

ACR Breast Density Category c: The breast tissue is heterogeneously
dense, which may obscure small masses.
FINDINGS: CC and MLO tomosynthesis views of the right breast were performed.
On the additional views, the possible asymmetry seen on screening
mammogram persists but is thought to likely represent normal
parenchymal tissue. No suspicious mass or calcifications are
identified.

Mammographic images were processed with CAD.

On physical exam, no discrete mass is felt in the area of concern
within the periareolar region.

Targeted ultrasound is performed, showing a circumscribed, oval,
hypoechoic mass at [DATE], 1 cm from the nipple measuring 6 x 5 x 4
mm. This finding may be incidental. No suspicious cystic or solid
sonographic finding was identified in the area of concern.
IMPRESSION: Probably benign right breast finding.

RECOMMENDATION:
1. Options including short-term follow-up versus ultrasound-guided
biopsy were discussed with the patient. The patient wishes to pursue
biopsy and will be scheduled at her convenience.
2. The patient was instructed to follow-up with her referring
physician regarding her area of persistent concern in her lower,
outer left breast.

I have discussed the findings and recommendations with the patient
through an interpreter. Results were also provided in writing at the
conclusion of the visit. If applicable, a reminder letter will be
sent to the patient regarding the next appointment.

BI-RADS CATEGORY  3: Probably benign.

## 2015-06-04 ENCOUNTER — Telehealth: Payer: Self-pay | Admitting: Oncology

## 2015-06-04 ENCOUNTER — Ambulatory Visit (HOSPITAL_BASED_OUTPATIENT_CLINIC_OR_DEPARTMENT_OTHER): Payer: BLUE CROSS/BLUE SHIELD | Admitting: Oncology

## 2015-06-04 VITALS — BP 122/80 | HR 85 | Temp 98.2°F | Resp 18 | Ht 61.0 in | Wt 125.6 lb

## 2015-06-04 DIAGNOSIS — Z853 Personal history of malignant neoplasm of breast: Secondary | ICD-10-CM

## 2015-06-04 DIAGNOSIS — C50912 Malignant neoplasm of unspecified site of left female breast: Secondary | ICD-10-CM

## 2015-06-04 MED ORDER — METRONIDAZOLE 1 % EX GEL: Freq: Every day | CUTANEOUS | Status: DC

## 2015-06-04 NOTE — Telephone Encounter (Signed)
s.w. pt and advised on may 2018 appt

## 2015-06-04 NOTE — Progress Notes (Signed)
ID: Sierra Mendoza   DOB: 1970/07/04  MR#: [id number]  ZGY#:[id number]   Sierra Pap, NP    CHIEF COMPLAINT: estrogen receptor positive breast cancer  CURRENT TREATMENT: observation  HISTORY OF PRESENT ILLNESS:  from the original intake note:  The patient herself palpated a mass in her left breast.  I believe she brought this to Dr. Herbie Baltimore Doolittle's attention and he set her up for mammography on 04/04/2006.  This indeed confirmed the presence of a possible mass in the left breast and diagnostic mammography on 04/11/2006 confirmed the presence of an ill-defined density in the left breast, which by ultrasound was irregular and hypoechoic, measuring up to 1.8 cm.  The mass was biopsied the same day and this showed ([id number] and [phone]) an invasive ductal carcinoma, which was 99% positive for the estrogen receptor, 100% positive for the progesterone receptor, with a low proliferation marker at 13%, negative for HER-2 at 1+.    With this information, the patient was referred to Dr. Rosana Hoes and on 04/19/2006, bilateral breast MRI were obtained.  This showed only the solitary area of enhancement in the left breast, which measured 2.0 cm by MRI.  There was a 2.8-cm non-enhancing mass in the right hepatic lobe, felt most likely to be a cyst.  With this information, after appropriate discussion, the patient proceeded to definitive left lumpectomy and sentinel lymph node biopsy under [facility] Hummer, 05/01/2006.  The final pathology ([id number]) showed a 2.0-cm infiltrating ductal carcinoma, grade 2, with negative margins and no evidence of lymphovascular invasion.  The sentinel lymph node was negative.   Her subsequent history is as detailed below  INTERVAL HISTORY:  She returns today for follow-up of her estrogen receptor positive breast cancer. We continue with observation alone. She is here today with an interpreter  REVIEW OF SYSTEMS:  Lahoma continues to have headaches about once or twice a week. They seem to  be related to the nights when she has trouble sleeping. They're not associated with sinus symptoms. She denies a cough or phlegm production. The headaches are easily relieved with Advil. She never tried Imitrex. Aside from these issues, a detailed review of systems today was noncontributory   PAST MEDICAL HISTORY: Past Medical History  Diagnosis Date  . Cancer     Left Breast cancer; lumpectomy with radiation; chemotherapy.  . Migraines   . Frequent headaches   . Blood in stool     PAST SURGICAL HISTORY: S/p cesarean section  FAMILY HISTORY The patient's parents are alive The patient has 2 brothers.  There is no history of breast or ovarian cancer in the family.  GYNECOLOGIC HISTORY: She is GX, P3, first pregnancy age 81, she is still menstruating regularly, although periods are now scant   SOCIAL HISTORY: She and her husband, Judith Part, run the [employer]  locally, close to [employer].  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  They have 3 daughters, the oldest currentl per week 77 at [employer].  The patient's brother is also in [place].     ADVANCED DIRECTIVES: not in place  HEALTH MAINTENANCE: Social History  Substance Use Topics  . Smoking status: Never Smoker   . Smokeless tobacco: Never Used  . Alcohol Use: No     Colonoscopy:  Mendoza:   Bone density:  Lipid panel:  No Known Allergies  Current Outpatient Prescriptions  Medication Sig Dispense Refill  . Cholecalciferol (VITAMIN D PO) Take 1 tablet by mouth  daily.     . fluconazole (DIFLUCAN) 150 MG tablet Take 1 tablet (150 mg total) by mouth once. 1 tablet 0  . ibuprofen (ADVIL,MOTRIN) 800 MG tablet Take 1 tablet (800 mg total) by mouth every 8 (eight) hours as needed. 30 tablet 1  . SUMAtriptan (IMITREX) 50 MG tablet TAKE ONE TABLET BY MOUTH AS NEEDED FOR MIGRAINE. MAY REPEAT ONE TABLET IN 2 HOURS IF HEADACHE PERSISTS OR RECURS 10 tablet 0  . SUMAtriptan (IMITREX) 50 MG  tablet TAKE ONE TABLET BY MOUTH AS NEEDED FOR MIGRAINE. MAY REPEAT ONE TABLET IN 2 HOURS IF HEADACHE PERSISTS OR RECURS 10 tablet 0   No current facility-administered medications for this visit.    OBJECTIVE: Early middle age  [place] Asian woman Who appears well  Filed Vitals:   06/04/15 1101  BP: 122/80  Pulse: 85  Temp: 98.2 F (36.8 C)  Resp: 18     Body mass index is 23.74 kg/(m^2).    ECOG FS: 1 Filed Weights   06/04/15 1101  Weight: 125 lb 9.6 oz (56.972 kg)   Sclerae unicteric, pupils round and equal Mild to moderate rosacea Oropharynx clear and moist-- no thrush or other lesions No cervical or supraclavicular adenopathy Lungs no rales or rhonchi Heart regular rate and rhythm Abd soft, nontender, positive bowel sounds MSK no focal spinal tenderness, no upper extremity lymphedema Neuro: nonfocal, well oriented, appropriate affect Breasts: The right breast is unremarkable. The left breast is status post lumpectomy and radiation. There is no evidence of local recurrence. The left axilla is benign.   LAB RESULTS: Lab Results  Component Value Date   WBC 4.5 05/28/2015   NEUTROABS 2.1 05/28/2015   HGB 11.6 05/28/2015   HCT 34.9 05/28/2015   MCV 86.2 05/28/2015   PLT 275 05/28/2015      Chemistry      Component Value Date/Time   NA 141 05/28/2015 1118   NA 136 03/30/2014 1559   K 4.2 05/28/2015 1118   K 3.8 03/30/2014 1559   CL 105 03/30/2014 1559   CL 106 05/02/2012 0904   CO2 27 05/28/2015 1118   CO2 23 03/30/2014 1559   BUN 8.8 05/28/2015 1118   BUN 12 03/30/2014 1559   CREATININE 0.8 05/28/2015 1118   CREATININE 0.74 03/30/2014 1559   CREATININE 0.68 03/07/2011 0901      Component Value Date/Time   CALCIUM 8.9 05/28/2015 1118   CALCIUM 9.1 03/30/2014 1559   ALKPHOS 54 05/28/2015 1118   ALKPHOS 61 03/30/2014 1559   AST 17 05/28/2015 1118   AST 19 03/30/2014 1559   ALT 11 05/28/2015 1118   ALT 23 03/30/2014 1559   BILITOT 0.65 05/28/2015 1118    BILITOT 0.3 03/30/2014 1559       STUDIES:  Mm Diag Breast Tomo Bilateral  05/22/2015  CLINICAL DATA:  History of a left lumpectomy in 2008 for breast carcinoma. Patient underwent a benign right breast biopsy last year. Patient has had an area of prominence the left breast which she is not feeling today. This has been evaluated previously with no abnormality, other than the postsurgical scarring, detected appear to EXAM: 2D DIGITAL DIAGNOSTIC BILATERAL MAMMOGRAM WITH CAD AND ADJUNCT TOMO COMPARISON:  Previous exam(s). ACR Breast Density Category c: The breast tissue is heterogeneously dense, which may obscure small masses. FINDINGS: Distortion in the left breast reflecting the postsurgical scarring is stable. There are no discrete masses, other areas of architectural distortion or suspicious calcifications. No mammographic change. Mammographic  images were processed with CAD. IMPRESSION: No evidence of recurrent or new breast malignancy. Benign postsurgical changes on the left. RECOMMENDATION: Screening mammogram in one year.(Code:SM-B-01Y) I have discussed the findings and recommendations with the patient. Results were also provided in writing at the conclusion of the visit. If applicable, a reminder letter will be sent to the patient regarding the next appointment. BI-RADS CATEGORY  2: Benign. Electronically Signed   By: Lajean Manes M.D.   On: 05/22/2015 12:11    ASSESSMENT: A 45 y.o.  Mandarin speaker currently residing in [place],  (1)   status post left lumpectomy and sentinel lymph node biopsy in April 2008 for a pT1c pN0, stage IA, invasive ductal carcinoma, grade 2, strongly  Estrogen and progesterone receptor positive, HER-2/neu negative, with low MIB-1.    (2)  Treated adjuvantly with cyclophosphamide and docetaxel x4 followed by radiation therapy,  Completed October 2008.   (3)  On tamoxifen between October 2008 and October 2013.    PLAN: Dacota is now 9 years out from her definitive  surgery with no evidence of disease recurrence. This is very favorable area  She is very concerned that there is a clip in her breast. I went over the fact that this is inner. She would like it removed. I explained we do not generally remove these and that it is important for it to be a marker of 4 she had a prior biopsy. Otherwise in the future there might be scar tissue there that might be confused with a new mass. She appears to be agreeable to keeping it in place, which is of course standard of care.  I do not believe her headaches are present brain metastatic disease. They are not constant, they are not increasing in intensity, and they have been present for years without any significant change. They are easily controlled with Motrin. Accordingly we are not proceeding to brain MRI or other evaluation.  I did write her a prescription for MetroGel for her rosacea, explaining how to use it. She will let us know if she has any problems from that.  At this point I'm comfortable releasing her to our survivorship program. I have made her an appointment with our survivorship nurse practitioner for a year from now, with lab work before that visit. At that point they can decide whether Kadeidra would like to continue to be seen here through survivorship or be released from follow-up.  She knows to call for any problems that may develop before her next visit here.  MAGRINAT,GUSTAV C    06/04/2015

## 2015-07-11 ENCOUNTER — Other Ambulatory Visit: Payer: Self-pay | Admitting: Oncology

## 2015-10-03 ENCOUNTER — Other Ambulatory Visit: Payer: Self-pay | Admitting: Oncology

## 2015-12-04 ENCOUNTER — Other Ambulatory Visit: Payer: Self-pay | Admitting: Oncology

## 2016-02-09 ENCOUNTER — Other Ambulatory Visit: Payer: Self-pay | Admitting: Oncology

## 2016-04-05 ENCOUNTER — Other Ambulatory Visit: Payer: Self-pay | Admitting: Emergency Medicine

## 2016-04-05 DIAGNOSIS — Z1231 Encounter for screening mammogram for malignant neoplasm of breast: Secondary | ICD-10-CM

## 2016-05-04 ENCOUNTER — Other Ambulatory Visit: Payer: Self-pay | Admitting: Oncology

## 2016-05-23 ENCOUNTER — Other Ambulatory Visit: Payer: Self-pay | Admitting: Adult Health

## 2016-05-23 DIAGNOSIS — C50912 Malignant neoplasm of unspecified site of left female breast: Secondary | ICD-10-CM

## 2016-05-24 ENCOUNTER — Other Ambulatory Visit (HOSPITAL_BASED_OUTPATIENT_CLINIC_OR_DEPARTMENT_OTHER): Payer: BLUE CROSS/BLUE SHIELD

## 2016-05-24 ENCOUNTER — Other Ambulatory Visit: Payer: Self-pay | Admitting: Internal Medicine

## 2016-05-24 ENCOUNTER — Encounter: Payer: Self-pay | Admitting: Adult Health

## 2016-05-24 ENCOUNTER — Other Ambulatory Visit: Payer: BLUE CROSS/BLUE SHIELD

## 2016-05-24 ENCOUNTER — Ambulatory Visit
Admission: RE | Admit: 2016-05-24 | Discharge: 2016-05-24 | Disposition: A | Discharge status: Home / Self Care | Payer: BLUE CROSS/BLUE SHIELD | Source: Ambulatory Visit | Attending: Emergency Medicine | Admitting: Emergency Medicine

## 2016-05-24 ENCOUNTER — Ambulatory Visit (HOSPITAL_BASED_OUTPATIENT_CLINIC_OR_DEPARTMENT_OTHER): Payer: BLUE CROSS/BLUE SHIELD | Admitting: Adult Health

## 2016-05-24 ENCOUNTER — Ambulatory Visit: Payer: BLUE CROSS/BLUE SHIELD

## 2016-05-24 VITALS — BP 114/72 | HR 72 | Temp 97.7°F | Resp 18 | Wt 120.0 lb

## 2016-05-24 DIAGNOSIS — Z853 Personal history of malignant neoplasm of breast: Secondary | ICD-10-CM | POA: Diagnosis not present

## 2016-05-24 DIAGNOSIS — Z170 Estrogen receptor positive status [ER+]: Secondary | ICD-10-CM

## 2016-05-24 DIAGNOSIS — Z1231 Encounter for screening mammogram for malignant neoplasm of breast: Secondary | ICD-10-CM

## 2016-05-24 DIAGNOSIS — C50912 Malignant neoplasm of unspecified site of left female breast: Secondary | ICD-10-CM

## 2016-05-24 HISTORY — DX: Personal history of irradiation: Z92.3

## 2016-05-24 HISTORY — DX: Personal history of antineoplastic chemotherapy: Z92.21

## 2016-05-24 LAB — CBC WITH DIFFERENTIAL/PLATELET
BASO%: 1.1 % (ref 0.0–2.0)
Basophils Absolute: 0.1 10*3/uL (ref 0.0–0.1)
EOS ABS: 0.1 10*3/uL (ref 0.0–0.5)
EOS%: 1.1 % (ref 0.0–7.0)
HCT: 35.5 % (ref 34.8–46.6)
HEMOGLOBIN: 11.7 g/dL (ref 11.6–15.9)
LYMPH#: 1.5 10*3/uL (ref 0.9–3.3)
LYMPH%: 31.3 % (ref 14.0–49.7)
MCH: 29.7 pg (ref 25.1–34.0)
MCHC: 33 g/dL (ref 31.5–36.0)
MCV: 89.8 fL (ref 79.5–101.0)
MONO#: 0.3 10*3/uL (ref 0.1–0.9)
MONO%: 5.8 % (ref 0.0–14.0)
NEUT%: 60.7 % (ref 38.4–76.8)
NEUTROS ABS: 2.9 10*3/uL (ref 1.5–6.5)
PLATELETS: 290 10*3/uL (ref 145–400)
RBC: 3.95 10*6/uL (ref 3.70–5.45)
RDW: 13.9 % (ref 11.2–14.5)
WBC: 4.8 10*3/uL (ref 3.9–10.3)

## 2016-05-24 LAB — COMPREHENSIVE METABOLIC PANEL
ALBUMIN: 3.8 g/dL (ref 3.5–5.0)
ALK PHOS: 48 U/L (ref 40–150)
ALT: 13 U/L (ref 0–55)
ANION GAP: 5 meq/L (ref 3–11)
AST: 18 U/L (ref 5–34)
BILIRUBIN TOTAL: 0.61 mg/dL (ref 0.20–1.20)
BUN: 11.8 mg/dL (ref 7.0–26.0)
CO2: 28 meq/L (ref 22–29)
CREATININE: 0.8 mg/dL (ref 0.6–1.1)
Calcium: 8.9 mg/dL (ref 8.4–10.4)
Chloride: 106 mEq/L (ref 98–109)
EGFR: 90 mL/min/{1.73_m2} — ABNORMAL LOW (ref >90)
GLUCOSE: 165 mg/dL — AB (ref 70–140)
Potassium: 3.9 mEq/L (ref 3.5–5.1)
Sodium: 139 mEq/L (ref 136–145)
TOTAL PROTEIN: 6.4 g/dL (ref 6.4–8.3)

## 2016-05-24 NOTE — Progress Notes (Signed)
CLINIC:  Survivorship   REASON FOR VISIT:  Routine follow-up for history of breast cancer.   BRIEF ONCOLOGIC HISTORY:  (1)   status post left lumpectomy and sentinel lymph node biopsy in April 2008 for a pT1c pN0, stage IA, invasive ductal carcinoma, grade 2, strongly  Estrogen and progesterone receptor positive, HER-2/neu negative, with low MIB-1.    (2)  Treated adjuvantly with cyclophosphamide and docetaxel x4 followed by radiation therapy,  Completed October 2008.    (3)  On tamoxifen between October 2008 and October 2013.     INTERVAL HISTORY:  Sierra Mendoza presents to the Survivorship Clinic today for routine follow-up for her history of breast cancer.  She speaks Mandarin Sierra and is accompanied by Sierra Mendoza.  She has been doing well.  She is not having any issues with her breasts.  She is not experiencing any new or persistent pain.  She has a PCP Sierra Mendoza.  She sees her PCP once a year.  She also sees a GYN and last had pap smear one year ago.  Mammogram last done today.  She exercises some times.      REVIEW OF SYSTEMS:  Review of Systems  Constitutional: Negative for appetite change, chills, diaphoresis, fatigue, fever and unexpected weight change.  HENT:   Negative for hearing loss and lump/Mendoza.   Eyes: Negative for eye problems and icterus.  Respiratory: Negative for chest tightness, cough, hemoptysis, shortness of breath and wheezing.   Cardiovascular: Negative for chest pain, leg swelling and palpitations.  Gastrointestinal: Negative for abdominal pain.  Endocrine: Negative for hot flashes.  Genitourinary: Negative for difficulty urinating, pelvic pain, vaginal bleeding and vaginal discharge.   Musculoskeletal: Negative for arthralgias, back pain and gait problem.  Skin: Negative for itching and rash.  Neurological: Negative for dizziness and gait problem.  Hematological: Negative for adenopathy. Does not bruise/bleed easily.    Psychiatric/Behavioral: Negative for depression. The patient is not nervous/anxious.   Breast: Denies any new nodularity, masses, tenderness, nipple changes, or nipple discharge.     PAST MEDICAL/SURGICAL HISTORY:  Past Medical History:  Diagnosis Date  . Blood in stool   . Cancer [facility])    Left Breast cancer; lumpectomy with radiation; chemotherapy.  . Frequent headaches   . Migraines   . Personal history of chemotherapy 2008  . Personal history of radiation therapy    Past Surgical History:  Procedure Laterality Date  . BREAST BIOPSY Right 2016   Benign   . BREAST BIOPSY Left 2008   Malignant Core   . BREAST LUMPECTOMY Left 2008   Malignant Lumectomy   . BREAST SURGERY Left    s/p L lumpectomy for breast cancer followed by radiation, chemotherapy.  . CESAREAN SECTION     2 c sections  . HYSTEROSCOPY WITH RESECTOSCOPE N/A 09/16/2014   Procedure: HYSTEROSCOPY WITH RESECTOSCOPE;  Surgeon: Sierra Mass, MD;  Location: [facility];  Service: Gynecology;  Laterality: N/A;     ALLERGIES:  No Known Allergies   CURRENT MEDICATIONS:  Outpatient Encounter Prescriptions as of 05/24/2016  Medication Sig  . Cholecalciferol (VITAMIN D PO) Take 1 tablet by mouth daily.   . RESTASIS MULTIDOSE 0.05 % ophthalmic emulsion   . SUMAtriptan (IMITREX) 50 MG tablet TAKE 1 TABLET BY MOUTH AS NEEDED FOR MIGRAINE. MAY REPEAT ONE TAB IN 2 HOURS IF HEADACHE PERSISTS OR RECURS  . ibuprofen (ADVIL,MOTRIN) 800 MG tablet Take 1 tablet (800 mg total) by mouth every 8 (eight) hours  as needed. (Patient not taking: Reported on 05/24/2016)  . SUMAtriptan (IMITREX) 50 MG tablet TAE 1 BY MOUTH AS NEEDED FOR MIGRAINE MAY REPEAT ONE TABLET IN 2 HOURS IF HEADACHE PRESISTS OR RECURS (Patient not taking: Reported on 05/24/2016)  . [DISCONTINUED] metroNIDAZOLE (METROGEL) 1 % gel Apply topically daily. (Patient not taking: Reported on 05/24/2016)   No facility-administered encounter medications on file as of 05/24/2016.       ONCOLOGIC FAMILY HISTORY:  Family History  Problem Relation Age of Onset  . Hypertension Father   . Breast cancer Neg Hx     GENETIC COUNSELING/TESTING: Wants to discuss  SOCIAL HISTORY:  Sierra Mendoza is married and lives with her husband in [place], [place].  She denies any current or history of tobacco, alcohol, or illicit drug use.     PHYSICAL EXAMINATION:  Vital Signs: Vitals:   05/24/16 1422  BP: 114/72  Pulse: 72  Resp: 18  Temp: 97.7 F (36.5 C)   Filed Weights   05/24/16 1422  Weight: 120 lb (54.4 kg)   General: Well-nourished, well-appearing female in no acute distress.  Unaccompanied today (with exception of interpreter).   HEENT: Head is normocephalic.  Pupils equal and reactive to light. Conjunctivae clear without exudate.  Sclerae anicteric. Oral mucosa is pink, moist.  Oropharynx is pink without lesions or erythema.  Lymph: No cervical, supraclavicular, or infraclavicular lymphadenopathy noted on palpation.  Cardiovascular: Regular rate and rhythm.Marland Kitchen Respiratory: Clear to auscultation bilaterally. Chest expansion symmetric; breathing non-labored.  Breast Exam:  -Left breast: No appreciable masses on palpation. No skin redness, thickening, or peau d'orange appearance; no nipple retraction or nipple discharge; mild distortion in symmetry at previous lumpectomy site well healed scar without erythema or nodularity.  -Right breast: No appreciable masses on palpation. No skin redness, thickening, or peau d'orange appearance; no nipple retraction or nipple discharge; -Axilla: No axillary adenopathy bilaterally.  GI: Abdomen soft and round; non-tender, non-distended. Bowel sounds normoactive. No hepatosplenomegaly.   GU: Deferred.  Neuro: No focal deficits. Steady gait.  Psych: Mood and affect normal and appropriate for situation.  Extremities: No edema. Skin: Warm and dry.  LABORATORY DATA:  Appointment on 05/24/2016  Component Date Value Ref Range  Status  . WBC 05/24/2016 4.8  3.9 - 10.3 10e3/uL Final  . NEUT# 05/24/2016 2.9  1.5 - 6.5 10e3/uL Final  . HGB 05/24/2016 11.7  11.6 - 15.9 g/dL Final  . HCT 05/24/2016 35.5  34.8 - 46.6 % Final  . Platelets 05/24/2016 290  145 - 400 10e3/uL Final  . MCV 05/24/2016 89.8  79.5 - 101.0 fL Final  . MCH 05/24/2016 29.7  25.1 - 34.0 pg Final  . MCHC 05/24/2016 33.0  31.5 - 36.0 g/dL Final  . RBC 05/24/2016 3.95  3.70 - 5.45 10e6/uL Final  . RDW 05/24/2016 13.9  11.2 - 14.5 % Final  . lymph# 05/24/2016 1.5  0.9 - 3.3 10e3/uL Final  . MONO# 05/24/2016 0.3  0.1 - 0.9 10e3/uL Final  . Eosinophils Absolute 05/24/2016 0.1  0.0 - 0.5 10e3/uL Final  . Basophils Absolute 05/24/2016 0.1  0.0 - 0.1 10e3/uL Final  . NEUT% 05/24/2016 60.7  38.4 - 76.8 % Final  . LYMPH% 05/24/2016 31.3  14.0 - 49.7 % Final  . MONO% 05/24/2016 5.8  0.0 - 14.0 % Final  . EOS% 05/24/2016 1.1  0.0 - 7.0 % Final  . BASO% 05/24/2016 1.1  0.0 - 2.0 % Final  . Sodium 05/24/2016 139  136 - 145 mEq/L Final  . Potassium 05/24/2016 3.9  3.5 - 5.1 mEq/L Final  . Chloride 05/24/2016 106  98 - 109 mEq/L Final  . CO2 05/24/2016 28  22 - 29 mEq/L Final  . Glucose 05/24/2016 165* 70 - 140 mg/dl Final  . BUN 05/24/2016 11.8  7.0 - 26.0 mg/dL Final  . Creatinine 05/24/2016 0.8  0.6 - 1.1 mg/dL Final  . Total Bilirubin 05/24/2016 0.61  0.20 - 1.20 mg/dL Final  . Alkaline Phosphatase 05/24/2016 48  40 - 150 U/L Final  . AST 05/24/2016 18  5 - 34 U/L Final  . ALT 05/24/2016 13  0 - 55 U/L Final  . Total Protein 05/24/2016 6.4  6.4 - 8.3 g/dL Final  . Albumin 05/24/2016 3.8  3.5 - 5.0 g/dL Final  . Calcium 05/24/2016 8.9  8.4 - 10.4 mg/dL Final  . Anion Gap 05/24/2016 5  3 - 11 mEq/L Final  . EGFR 05/24/2016 90* >90 ml/min/1.73 m2 Final     DIAGNOSTIC IMAGING:  Most recent mammogram:      ASSESSMENT AND PLAN:  Ms.. Mendoza is a pleasant 46 y.o. female with history of Stage IA left breast invasive ductal carcinoma, ER+/PR+/HER2-,  diagnosed in 2008, treated with lumpectomy, adjvuant chemotherapy, adjuvant radiation therapy, and anti-estrogen therapy with Tamoxifen x 5 years.  She presents to the Survivorship Clinic for surveillance and routine follow-up.   1. History of breast cancer:  Sierra Mendoza is currently clinically and radiographically without evidence of disease or recurrence of breast cancer. She will be due for mammogram in 05/2017.   She continues to be cancer free.  After discussion with patient, we will send her for genetics appointment.  She is keeping up with her PCP and GYN, so She will graduate today from follow up.  We will now see her on an as needed basis, unless there are any questions or concerns.   2. Cancer screening:  Due to Sierra Mendoza's history and her age, she should receive screening for skin cancers, colon cancer (in two years), and gynecologic cancers. She was encouraged to follow-up with her PCP for appropriate cancer screenings.   3. Health maintenance and wellness promotion: Sierra Mendoza was encouraged to consume 5-7 servings of fruits and vegetables per day. She was also encouraged to engage in moderate to vigorous exercise for 30 minutes per day most days of the week. She was instructed to limit her alcohol consumption and continue to abstain from tobacco use.    Dispo:  -Return to cancer center on an as needed basis -genetics appt   A total of (30) minutes of face-to-face time was spent with this patient with greater than 50% of that time in counseling and care-coordination.   Gardenia Phlegm, NP Survivorship Program [place] [phone]   Note: PRIMARY CARE PROVIDER Irene Pap, NP None None

## 2016-05-27 ENCOUNTER — Encounter: Payer: BLUE CROSS/BLUE SHIELD | Admitting: Adult Health

## 2016-06-02 ENCOUNTER — Other Ambulatory Visit: Payer: BLUE CROSS/BLUE SHIELD

## 2016-06-02 ENCOUNTER — Encounter: Payer: BLUE CROSS/BLUE SHIELD | Admitting: Nurse Practitioner

## 2016-06-07 ENCOUNTER — Encounter: Payer: BLUE CROSS/BLUE SHIELD | Admitting: Genetics

## 2016-06-07 ENCOUNTER — Other Ambulatory Visit: Payer: BLUE CROSS/BLUE SHIELD

## 2016-06-08 ENCOUNTER — Encounter: Payer: Self-pay | Admitting: Gynecology

## 2016-06-15 ENCOUNTER — Other Ambulatory Visit: Payer: Self-pay | Admitting: *Deleted

## 2016-06-15 DIAGNOSIS — C50912 Malignant neoplasm of unspecified site of left female breast: Secondary | ICD-10-CM

## 2016-06-15 DIAGNOSIS — Z170 Estrogen receptor positive status [ER+]: Principal | ICD-10-CM

## 2016-06-17 ENCOUNTER — Encounter: Payer: BLUE CROSS/BLUE SHIELD | Admitting: Adult Health

## 2016-06-17 ENCOUNTER — Other Ambulatory Visit: Payer: BLUE CROSS/BLUE SHIELD

## 2016-06-28 ENCOUNTER — Other Ambulatory Visit: Payer: BLUE CROSS/BLUE SHIELD

## 2016-06-28 ENCOUNTER — Ambulatory Visit (HOSPITAL_BASED_OUTPATIENT_CLINIC_OR_DEPARTMENT_OTHER): Payer: BLUE CROSS/BLUE SHIELD | Admitting: Genetics

## 2016-06-28 DIAGNOSIS — Z853 Personal history of malignant neoplasm of breast: Secondary | ICD-10-CM | POA: Diagnosis not present

## 2016-06-28 DIAGNOSIS — Z7183 Encounter for nonprocreative genetic counseling: Secondary | ICD-10-CM

## 2016-06-28 NOTE — Progress Notes (Signed)
REFERRING PROVIDER: Gardenia Phlegm, NP [place], [place] [address]  PRIMARY PROVIDER:  Irene Pap, NP  PRIMARY REASON FOR VISIT:  1. Personal history of breast cancer      HISTORY OF PRESENT ILLNESS:   Ms. Veals, a 46 y.o. female, was seen for a [place] cancer genetics consultation at the request of Wilber Bihari, NP due to a personal history of cancer.  Ms. Bartolome presents to clinic today to discuss the possibility of a hereditary predisposition to cancer, genetic testing, and to further clarify her future cancer risks, as well as potential cancer risks for family members. Ms. Sayegh is accompanied to her appointment by an interpreter.  In April 2008, at the age of 73, Ms. 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 This was treated with lumpectomy, chemotherapy, radiation, and tamoxifen x5years. Ms. Pangilinan recently had a mammogram in May 2018, BIRADS 1.   CANCER HISTORY:    Breast cancer, left breast ([facility])   05/01/2006 Surgery    Left lumpectomy: IDC, 2cm, grade 2, margins neg, 1 SLN negative, T1c, N0, M0, ER+(99%), PR+(100%), Ki-67 13%, HER-2 negative       Adjuvant Chemotherapy    Docetaxel and Cyclophosphamide x 4 cycles       Radiation Therapy    Adjuvant radiation      10/2006 - 10/2011 Anti-estrogen oral therapy    Tamoxifen daily       Past Medical History:  Diagnosis Date  . Blood in stool   . Cancer [facility])    Left Breast cancer; lumpectomy with radiation; chemotherapy.  . Frequent headaches   . Migraines   . Personal history of chemotherapy 2008  . Personal history of radiation therapy     Past Surgical History:  Procedure Laterality Date  . BREAST BIOPSY Right 2016   Benign   . BREAST BIOPSY Left 2008   Malignant Core   . BREAST LUMPECTOMY Left 2008   Malignant Lumectomy   . BREAST SURGERY Left    s/p L lumpectomy for breast cancer followed by radiation, chemotherapy.    . CESAREAN SECTION     2 c sections  . HYSTEROSCOPY WITH RESECTOSCOPE N/A 09/16/2014   Procedure: HYSTEROSCOPY WITH RESECTOSCOPE;  Surgeon: Terrance Mass, MD;  Location: [facility];  Service: Gynecology;  Laterality: N/A;    Social History   Social History  . Marital status: Married    Spouse name: Wa Johny Chess  . Number of children: 3  . Years of education: 12th grade   Occupational History  . restaurant     [employer]	[place]   Social History Main Topics  . Smoking status: Never Smoker  . Smokeless tobacco: Never Used  . Alcohol use No  . Drug use: Unknown  . Sexual activity: Yes    Birth control/ protection: IUD   Other Topics Concern  . Not on file   Social History Narrative   Marital status: married since 1996.      Children: three daughters       Lives: with husband, 3 daughters.  From  [place]; moved to [place] 1991.      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      Tobacco: none      Alcohol: none      Drugs: none      Exercise:  Running four days per week; 30 minutes.      Seatbelt:  100%           FAMILY HISTORY:  We obtained a detailed, 4-generation family history.  Significant diagnoses are listed below: Family History  Problem Relation Age of Onset  . Hypertension Father   . Breast cancer Neg Hx    Ms. Oesterling has no known family history of cancer.   She has three daughters, ages 1, 50, and 70. She has two brothers, age 27 and 69. Ms. Lacombe father is 49 and her mother is 31.   Ms. Wirick has three maternal aunts and two maternal uncles who are all living. A third maternal uncle died at 16 in a car accident. Ms. Corson maternal grandmother died at 40. This grandmother's mother died at 72.  Ms. Southgate has four paternal uncles and two paternal aunts who are all living. Ms. Massett paternal grandfather died at 57 and her paternal grandmother died at 26.   Ms. Eggleton is unaware of previous family history of genetic testing for hereditary cancer risks. Patient's maternal  and paternal ancestors are of [place] descent. There is no reported Ashkenazi Jewish ancestry. There is no known consanguinity.  GENETIC COUNSELING ASSESSMENT: JAREN KEARN is a 47 y.o. female with a personal history of breast cancer at age 76 which is somewhat suggestive of a hereditary cancer syndrome and predisposition to cancer. We, therefore, discussed and recommended the following at today's visit.   DISCUSSION: We reviewed the characteristics, features and inheritance patterns of hereditary cancer syndromes. We also discussed genetic testing, including the appropriate family members to test, the process of testing, insurance coverage and turn-around-time for results. We discussed the implications of a negative, positive and/or variant of uncertain significant result. We recommended Ms. Truman Hayward pursue genetic testing for the Common Hereditary Cancers Panel offered by [facility]. [facility]'s Common Hereditary Cancers Panel includes analysis of the following 46 genes: APC, ATM, AXIN2, BARD1, BMPR1A, BRCA1, BRCA2, BRIP1, CDH1, CDKN2A, CHEK2, CTNNA1, DICER1, EPCAM, GREM1, HOXB13, KIT, MEN1, MLH1, MSH2, MSH3, MSH6, MUTYH, NBN, NF1, NTHL1, PALB2, PDGFRA, PMS2, POLD1, POLE, PTEN, RAD50, RAD51C, RAD51D, SDHA, SDHB, SDHC, SDHD, SMAD4, SMARCA4, STK11, TP53, TSC1, TSC2, and VHL.   Based on Ms. Pike personal history of cancer, she meets medical criteria for genetic testing. Despite that she meets criteria, she may still have an out of pocket cost. We discussed that if her out of pocket cost for testing is over $100, the laboratory will call and confirm whether she wants to proceed with testing.  If the out of pocket cost of testing is less than $100 she will be billed by the genetic testing laboratory. During her appointment, Ms. Wilbert asked about insurance coverage and out of pocket expense multiple times. Therefore, we used [facility]'s online cost-estimator to obtain an ESTIMATE of of Ms. Purington out of pocket expense. We  discussed the following information:     PLAN: Despite our recommendation, Ms. Slavens did not wish to pursue genetic testing at today's visit due to concerns about cost of testing. She also stated that she wanted to think more about whether she wanted the information the test may provide. We understand this decision, and remain available to coordinate genetic testing at any time in the future. We; therefore, recommend Ms. Fiallo continue to follow the cancer screening guidelines given by her primary healthcare provider.  Based on Ms. Chiasson personal history of breast cancer at age 46, we discussed that current recommendations state that her daughters should begin annual mammograms/annual breast cancer screenings at age 33 (43  years prior to Ms. Borromeo's age at diagnosis). Ms. Koehler daughters are encouraged to discuss their family history of cancer with their providers to establish a personalized cancer screening program. Ms. Wenk expressed understanding of this recommendation for her daughters.  Lastly, we encouraged Ms. Marcinek to remain in contact with cancer genetics annually so that we can continuously update the family history and inform her of any changes in cancer genetics and testing that may be of benefit for this family.   Ms.  Toppins questions were answered to her satisfaction today. Our contact information was provided should additional questions or concerns arise. Thank you for the referral and allowing [place] to share in the care of your patient.   Mal Misty, MS, [facility] Certified Naval [email or web address] phone: [phone]  The patient was seen for a total of 35 minutes in face-to-face genetic counseling.  This patient was discussed with Drs. Magrinat, Lindi Adie and/or Burr Medico who agrees with the above.    _______________________________________________________________________ For Office Staff:  Number of people involved in session: 1 Was an Intern/ student involved with case:  no

## 2016-08-25 ENCOUNTER — Other Ambulatory Visit: Payer: Self-pay | Admitting: Oncology

## 2016-11-05 ENCOUNTER — Other Ambulatory Visit: Payer: Self-pay | Admitting: Oncology

## 2017-04-17 ENCOUNTER — Other Ambulatory Visit: Payer: Self-pay | Admitting: Oncology

## 2017-04-19 ENCOUNTER — Other Ambulatory Visit: Payer: Self-pay | Admitting: Internal Medicine

## 2017-04-19 DIAGNOSIS — Z1231 Encounter for screening mammogram for malignant neoplasm of breast: Secondary | ICD-10-CM

## 2017-05-25 ENCOUNTER — Ambulatory Visit
Admission: RE | Admit: 2017-05-25 | Discharge: 2017-05-25 | Disposition: A | Discharge status: Home / Self Care | Payer: BLUE CROSS/BLUE SHIELD | Source: Ambulatory Visit | Attending: Internal Medicine | Admitting: Internal Medicine

## 2017-05-25 DIAGNOSIS — Z1231 Encounter for screening mammogram for malignant neoplasm of breast: Secondary | ICD-10-CM

## 2017-07-22 ENCOUNTER — Other Ambulatory Visit: Payer: Self-pay | Admitting: Oncology

## 2017-07-24 NOTE — Telephone Encounter (Signed)
Refilled RX for patient, this nurse informed patient that future refills with need to be through PCP.  Patient voiced understanding.

## 2017-09-21 ENCOUNTER — Other Ambulatory Visit: Payer: Self-pay | Admitting: Oncology

## 2017-09-29 ENCOUNTER — Telehealth: Payer: Self-pay

## 2017-09-29 NOTE — Telephone Encounter (Signed)
Called patient and she needed assistance with her Rx. per 9/6 message return. Transferred call to Camera operator (Val)

## 2019-06-28 ENCOUNTER — Other Ambulatory Visit: Payer: Self-pay | Admitting: Internal Medicine

## 2020-06-25 ENCOUNTER — Other Ambulatory Visit: Payer: Self-pay | Admitting: Nurse Practitioner

## 2020-06-25 DIAGNOSIS — Z1231 Encounter for screening mammogram for malignant neoplasm of breast: Secondary | ICD-10-CM

## 2020-08-24 ENCOUNTER — Ambulatory Visit
Admission: RE | Admit: 2020-08-24 | Discharge: 2020-08-24 | Disposition: A | Discharge status: Home / Self Care | Payer: 59 | Source: Ambulatory Visit | Attending: Nurse Practitioner | Admitting: Nurse Practitioner

## 2020-08-24 ENCOUNTER — Other Ambulatory Visit: Payer: Self-pay

## 2020-08-24 DIAGNOSIS — Z1231 Encounter for screening mammogram for malignant neoplasm of breast: Secondary | ICD-10-CM

## 2020-08-24 HISTORY — DX: Malignant neoplasm of unspecified site of unspecified female breast: C50.919
